# Patient Record
Sex: Male | Born: 1965 | Race: White | Hispanic: No | Smoking: Never smoker
Health system: Southern US, Community
[De-identification: ages and names within clinical notes are randomized; demographics above are authoritative.]

## PROBLEM LIST (undated history)

## (undated) DIAGNOSIS — K649 Unspecified hemorrhoids: Secondary | ICD-10-CM

## (undated) DIAGNOSIS — F329 Major depressive disorder, single episode, unspecified: Secondary | ICD-10-CM

## (undated) DIAGNOSIS — Z87442 Personal history of urinary calculi: Secondary | ICD-10-CM

## (undated) DIAGNOSIS — K219 Gastro-esophageal reflux disease without esophagitis: Secondary | ICD-10-CM

## (undated) DIAGNOSIS — IMO0002 Reserved for concepts with insufficient information to code with codable children: Secondary | ICD-10-CM

## (undated) DIAGNOSIS — G4730 Sleep apnea, unspecified: Secondary | ICD-10-CM

## (undated) DIAGNOSIS — F419 Anxiety disorder, unspecified: Secondary | ICD-10-CM

## (undated) DIAGNOSIS — E7800 Pure hypercholesterolemia, unspecified: Secondary | ICD-10-CM

## (undated) DIAGNOSIS — M109 Gout, unspecified: Secondary | ICD-10-CM

## (undated) DIAGNOSIS — M549 Dorsalgia, unspecified: Secondary | ICD-10-CM

## (undated) DIAGNOSIS — F32A Depression, unspecified: Secondary | ICD-10-CM

## (undated) DIAGNOSIS — R0989 Other specified symptoms and signs involving the circulatory and respiratory systems: Secondary | ICD-10-CM

## (undated) DIAGNOSIS — E039 Hypothyroidism, unspecified: Secondary | ICD-10-CM

## (undated) DIAGNOSIS — M539 Dorsopathy, unspecified: Secondary | ICD-10-CM

## (undated) DIAGNOSIS — G8929 Other chronic pain: Secondary | ICD-10-CM

## (undated) HISTORY — DX: Other specified symptoms and signs involving the circulatory and respiratory systems: R09.89

## (undated) HISTORY — DX: Unspecified hemorrhoids: K64.9

## (undated) HISTORY — DX: Sleep apnea, unspecified: G47.30

## (undated) HISTORY — DX: Dorsalgia, unspecified: M54.9

## (undated) HISTORY — PX: MANDIBLE FRACTURE SURGERY: SHX706

## (undated) HISTORY — DX: Depression, unspecified: F32.A

## (undated) HISTORY — PX: KNEE ARTHROSCOPY: SUR90

## (undated) HISTORY — DX: Major depressive disorder, single episode, unspecified: F32.9

## (undated) HISTORY — PX: OTHER SURGICAL HISTORY: SHX169

## (undated) HISTORY — DX: Other chronic pain: G89.29

## (undated) HISTORY — DX: Gastro-esophageal reflux disease without esophagitis: K21.9

## (undated) HISTORY — DX: Reserved for concepts with insufficient information to code with codable children: IMO0002

## (undated) HISTORY — DX: Anxiety disorder, unspecified: F41.9

---

## 1999-01-01 ENCOUNTER — Encounter: Admission: RE | Admit: 1999-01-01 | Discharge: 1999-04-01 | Payer: Self-pay | Admitting: Anesthesiology

## 1999-03-13 ENCOUNTER — Inpatient Hospital Stay (HOSPITAL_COMMUNITY): Admission: RE | Admit: 1999-03-13 | Discharge: 1999-03-14 | Payer: Self-pay | Admitting: Neurosurgery

## 1999-03-13 ENCOUNTER — Encounter: Payer: Self-pay | Admitting: Neurosurgery

## 1999-04-28 ENCOUNTER — Encounter: Payer: Self-pay | Admitting: Neurosurgery

## 1999-04-28 ENCOUNTER — Encounter: Admission: RE | Admit: 1999-04-28 | Discharge: 1999-04-28 | Payer: Self-pay | Admitting: Neurosurgery

## 1999-09-15 ENCOUNTER — Encounter: Admission: RE | Admit: 1999-09-15 | Discharge: 1999-12-14 | Payer: Self-pay | Admitting: Anesthesiology

## 2004-07-29 ENCOUNTER — Ambulatory Visit: Payer: Self-pay | Admitting: Cardiology

## 2004-08-13 ENCOUNTER — Ambulatory Visit: Payer: Self-pay | Admitting: Cardiology

## 2006-03-12 ENCOUNTER — Ambulatory Visit: Payer: Self-pay | Admitting: Gastroenterology

## 2006-03-23 ENCOUNTER — Ambulatory Visit (HOSPITAL_COMMUNITY): Admission: RE | Admit: 2006-03-23 | Discharge: 2006-03-23 | Payer: Self-pay | Admitting: Gastroenterology

## 2006-03-23 ENCOUNTER — Ambulatory Visit: Payer: Self-pay | Admitting: Gastroenterology

## 2006-03-23 ENCOUNTER — Encounter (INDEPENDENT_AMBULATORY_CARE_PROVIDER_SITE_OTHER): Payer: Self-pay | Admitting: Specialist

## 2006-05-06 ENCOUNTER — Ambulatory Visit: Payer: Self-pay | Admitting: Gastroenterology

## 2006-07-16 ENCOUNTER — Ambulatory Visit (HOSPITAL_COMMUNITY): Admission: RE | Admit: 2006-07-16 | Discharge: 2006-07-16 | Payer: Self-pay | Admitting: Neurology

## 2006-09-09 ENCOUNTER — Ambulatory Visit: Payer: Self-pay | Admitting: Cardiology

## 2007-10-31 ENCOUNTER — Ambulatory Visit: Payer: Self-pay | Admitting: Cardiology

## 2007-11-29 ENCOUNTER — Encounter: Payer: Self-pay | Admitting: Cardiology

## 2009-11-05 ENCOUNTER — Telehealth (INDEPENDENT_AMBULATORY_CARE_PROVIDER_SITE_OTHER): Payer: Self-pay | Admitting: *Deleted

## 2009-11-10 ENCOUNTER — Emergency Department (HOSPITAL_COMMUNITY)
Admission: EM | Admit: 2009-11-10 | Discharge: 2009-11-10 | Payer: Self-pay | Source: Home / Self Care | Admitting: Emergency Medicine

## 2009-11-25 ENCOUNTER — Encounter: Payer: Self-pay | Admitting: Cardiology

## 2009-12-17 ENCOUNTER — Encounter (INDEPENDENT_AMBULATORY_CARE_PROVIDER_SITE_OTHER): Payer: Self-pay | Admitting: *Deleted

## 2009-12-17 ENCOUNTER — Ambulatory Visit: Payer: Self-pay | Admitting: Cardiology

## 2009-12-17 DIAGNOSIS — G894 Chronic pain syndrome: Secondary | ICD-10-CM | POA: Insufficient documentation

## 2009-12-17 DIAGNOSIS — I10 Essential (primary) hypertension: Secondary | ICD-10-CM | POA: Insufficient documentation

## 2009-12-17 DIAGNOSIS — R079 Chest pain, unspecified: Secondary | ICD-10-CM | POA: Insufficient documentation

## 2010-01-03 ENCOUNTER — Ambulatory Visit: Payer: Self-pay | Admitting: Cardiology

## 2010-01-09 ENCOUNTER — Encounter (INDEPENDENT_AMBULATORY_CARE_PROVIDER_SITE_OTHER): Payer: Self-pay | Admitting: *Deleted

## 2010-01-15 ENCOUNTER — Telehealth (INDEPENDENT_AMBULATORY_CARE_PROVIDER_SITE_OTHER): Payer: Self-pay | Admitting: *Deleted

## 2010-01-20 ENCOUNTER — Telehealth (INDEPENDENT_AMBULATORY_CARE_PROVIDER_SITE_OTHER): Payer: Self-pay | Admitting: *Deleted

## 2010-01-30 ENCOUNTER — Telehealth (INDEPENDENT_AMBULATORY_CARE_PROVIDER_SITE_OTHER): Payer: Self-pay | Admitting: *Deleted

## 2010-02-03 ENCOUNTER — Telehealth (INDEPENDENT_AMBULATORY_CARE_PROVIDER_SITE_OTHER): Payer: Self-pay | Admitting: *Deleted

## 2010-03-18 ENCOUNTER — Encounter: Payer: Self-pay | Admitting: Cardiology

## 2010-07-03 ENCOUNTER — Encounter: Payer: Self-pay | Admitting: Internal Medicine

## 2010-07-03 ENCOUNTER — Ambulatory Visit
Admission: RE | Admit: 2010-07-03 | Discharge: 2010-07-03 | Payer: Self-pay | Source: Home / Self Care | Attending: Gastroenterology | Admitting: Gastroenterology

## 2010-07-03 DIAGNOSIS — K625 Hemorrhage of anus and rectum: Secondary | ICD-10-CM | POA: Insufficient documentation

## 2010-07-03 DIAGNOSIS — R1013 Epigastric pain: Secondary | ICD-10-CM | POA: Insufficient documentation

## 2010-07-16 LAB — BASIC METABOLIC PANEL
BUN: 12 mg/dL (ref 6–23)
CO2: 27 mEq/L (ref 19–32)
Calcium: 9.6 mg/dL (ref 8.4–10.5)
Chloride: 97 mEq/L (ref 96–112)
Creatinine, Ser: 0.96 mg/dL (ref 0.4–1.5)
GFR calc Af Amer: >60 mL/min (ref >60)
GFR calc non Af Amer: >60 mL/min (ref >60)
Glucose, Bld: 97 mg/dL (ref 70–99)
Potassium: 4.6 mEq/L (ref 3.5–5.1)
Sodium: 135 mEq/L (ref 135–145)

## 2010-07-16 LAB — HEMOGLOBIN AND HEMATOCRIT, BLOOD
HCT: 45.4 % (ref 39.0–52.0)
Hemoglobin: 16.1 g/dL (ref 13.0–17.0)

## 2010-07-17 ENCOUNTER — Ambulatory Visit (HOSPITAL_COMMUNITY)
Admission: RE | Admit: 2010-07-17 | Discharge: 2010-07-17 | Payer: Self-pay | Source: Home / Self Care | Attending: Internal Medicine | Admitting: Internal Medicine

## 2010-07-22 NOTE — Op Note (Signed)
NAME:  Craig Drake, Craig Drake               ACCOUNT NO.:  [id number]  MEDICAL RECORD NO.:  [id number]          PATIENT TYPE:  AMB  LOCATION:  DAY                           FACILITY:  [facility]  PHYSICIAN:  R. Roetta Sessions, M.D. DATE OF BIRTH:  1965-12-22  DATE OF PROCEDURE:  07/17/2010 DATE OF DISCHARGE:                              OPERATIVE REPORT   PROCEDURE:  EGD with esophageal dilation followed by gastric esophageal biopsy followed by diagnostic colonoscopy.  INDICATIONS FOR PROCEDURE:  A 45 year old gentleman wound with intermittent epigastric pain, intermittent reflux symptoms, nausea, some dysphagia to solids, and history of a known Schatzki's ring, also intermittent blood per rectum.  EGD and colonoscopy now being done. Risks, benefits, limitations, alternatives, imponderables have been discussed, questions answered.  Please see the documentation in medical record.  PROCEDURE NOTE:  O2 saturation, blood pressure, pulse, respirations were monitored throughout the entire procedure.  CONSCIOUS SEDATION:  Propofol per Dr. [facility]. Cetacaine spray for topical pharyngeal anesthesia.  INSTRUMENT:  Pentax video chip system.  FINDINGS:  EGD.  Examination of the tubular esophagus revealed circumferential distal esophageal erosions within 2 cm of the GE junction.  There was also appeared to be a soft short noncritical appearing stricture at this level.  EG junction was easily traversed with the scope.  Stomach:  Gastric cavity was emptied and insufflated well with air. Thorough examination of gastric mucosa including retroflexion of proximal stomach, esophagogastric junction demonstrated small hiatal hernia and multiple areas of nodularity with overlying erosions in the antrum.  There is no ulcer infiltrating process.  Pylorus was patent, easily traversed.  Examination of the bulb and second portion reveals a tiny bulbar erosions only.  THERAPEUTIC/DIAGNOSTIC MANEUVERS  PERFORMED:  Scope was withdrawn.  A 56- French Maloney dilator was passed to full insertion.  A look back revealed two superficial tears in proximal esophagus and mid esophagus. The stricture had been dilated as well with complication and with minimal bleeding.  Looking further for at the esophagus, there appeared to be some longitudinal furring of the esophagus.  There was no ringed appearance, however, there was a little area of nodularity with central depression in the proximal esophagus.  Please see photos.  I elected to go ahead and biopsy the nodule proximally separately in the mid distal esophagus just to make sure we are not doing with the element of his eosinophilic esophagitis as well.  Subsequent biopsies of the antral erosions were taken.  The patient tolerated the procedure well and was prepared for colonoscopy.  Digital rectal exam revealed no abnormalities.  Endoscopic findings:  Prep was adequate.  Colon: Colonic mucosa was surveyed from the rectosigmoid junction through the left transverse right colon to the appendiceal orifice, ileocecal valve/cecum.  These structures were well seen and photographed for the record.  From this level, scope was slowly and cautiously withdrawn. All previously mentioned mucosal surfaces were again seen.  The patient was noted to have scattered pancolonic diverticula.  However, the remainder of colonic mucosa appeared normal.  Scope was pulled down in to the rectum where a through examination of rectal mucosa including retroflexed view  of the anal verge demonstrated only some internal hemorrhoids.  The patient tolerated this procedure well.  Cecal withdrawal time 10 minutes.  IMPRESSION: 1. EGD.  Circumferential distal esophageal erosions superimposed on a     soft peptic stricture area of nodularity with central pressure,     proximal esophagus of uncertain significance status post biopsy,     status post esophageal body biopsies performed  after a 56-French     Maloney dilator was passed. 2. Small hiatal hernia, antral erosions status post biopsy bulbar     erosions otherwise D1 and D2 appeared normal.  COLONOSCOPY FINDINGS:  Internal hemorrhoids, otherwise normal rectum, pancolonic diverticula and colonic mucosa appeared normal.  RECOMMENDATIONS: 1. Clear liquid diet.  Remainder of the diet advanced slowly to a     regular diet tomorrow. 2. Stop omeprazole, begin Dexilant 60 mg orally daily.  Prescription     provided literature on hemorrhoids and diverticulosis and reflux     provided to Mr. Benassi. 3. Carafate suspension 1 g q.i.d. x5 days. 4. Benefiber, fiber supplement 1 tablespoon daily. 5. Further recommendations to follow pending review of path.     Jonathon Bellows, M.D.     RMR/MEDQ  D:  07/17/2010  T:  07/17/2010  Job:  [id number]  Electronically Signed by Lorrin Goodell M.D. on 07/22/2010 01:41:02 PM

## 2010-07-24 NOTE — Letter (Signed)
Summary: External Correspondence/ OFFICE VISIT [facility]  External Correspondence/ OFFICE VISIT [facility]   Imported By: Dorise Hiss 12/24/2009 14:07:59  _____________________________________________________________________  External Attachment:    Type:   Image     Comment:   External Document

## 2010-07-24 NOTE — Progress Notes (Signed)
Summary: PLEASE CALL TO TOUCH BASE ON SPELL IN SHOWER  Phone Note Call from Patient Call back at Home Phone [phone]   Caller: Patient Call For: NURSE Summary of Call: wants to touch base with nurse r/e spell he had in shower last week and now c/o pain in feet that he thinks came from all the hussle and bustle of getting children prepared for school this week. Nurse returned call and left message on machine since patient didn't pick up phone. Initial call taken by: Carlye Grippe,  February 03, 2010 10:41 AM  Follow-up for Phone Call        spoke with patient and he was informing us that he has informed his neurologist and received an earlier appointment with neurologist to evaluate spell in shower last week. Once again nurse informed him that MD here said that his symptoms needed to be evaluated by his PCP also and with neurologist since these c/o were no acute cardiac issues. Patient verbalized understanding of plan. Follow-up by: Carlye Grippe,  February 03, 2010 11:56 AM

## 2010-07-24 NOTE — Progress Notes (Signed)
Summary: elevated bp  Phone Note Call from Patient   Summary of Call: c/o having issues with elevated blood pressure.  Currently taking 2 bp meds.  States he has just moved back to [place] from [place].  Also, states that he really doesn't have PMD and Dr. Gerilyn Pilgrim has been treating him for everything.  BP is continually staying elevated as high as 115 on the bottom.  Advised him to establish with PMD and f/u with [place] until we see him in June.  Will mail list of PMD to pt.  Patient verbalized understanding.  Initial call taken by: Hoover Brunette, LPN,  Nov 05, 2009 9:53 AM

## 2010-07-24 NOTE — Progress Notes (Signed)
Summary: CONCERNS FOR HAVING A STROKE   Phone Note Call from Patient   Caller: Patient Call For: nurse Summary of Call: patient  called asking what are the chances of him having a stroke and patient's  phone went out. Called patient back and he request to have full set of labs and the chances of him having a stroke. Nurse informed him per MD that he didn't have any acute cardiac issues and to discuss need for labs with new PCP he will be choosing. Once again,patients phone cut off from low battery.  Initial call taken by: Carlye Grippe,  January 20, 2010 4:41 PM  Follow-up for Phone Call        definitely needs to speak with LMD.  Follow-up by: Lewayne Bunting, MD, [facility],  January 20, 2010 6:35 PM  Additional Follow-up for Phone Call Additional follow up Details #1::        Patient informed of the above via machine.  Additional Follow-up by: Carlye Grippe,  January 21, 2010 12:47 PM

## 2010-07-24 NOTE — Letter (Signed)
Summary: Engineer, materials at [facility]  [address]   [place], [place] [address]   Phone: [phone]  Fax: [phone]        January 09, 2010 MRN: [id number]   PASCUAL MANTEL [address] [place], [place]  [address]   Dear Mr. MEUTH,  Your test ordered by Selena Batten has been reviewed by your physician (or physician assistant) and was found to be normal or stable. Your physician (or physician assistant) felt no changes were needed at this time.  __X_ Echocardiogram  ____ Cardiac Stress Test  ____ Lab Work  ____ Peripheral vascular study of arms, legs or neck  ____ CT scan or X-ray  ____ Lung or Breathing test  ____ Other:   Thank you.   Hoover Brunette, LPN    Duane Boston, M.D., F.A.C.C. Thressa Sheller, M.D., F.A.C.C. Oneal Grout, M.D., F.A.C.C. Cheree Ditto, M.D., F.A.C.C. Daiva Nakayama, M.D., F.A.C.C. Kenney Houseman, M.D., F.A.C.C. Jeanne Ivan, PA-C

## 2010-07-24 NOTE — Assessment & Plan Note (Signed)
Summary: elevated bp  --agh   Visit Type:  elevated BP,chest pain Primary Provider:  none   History of Present Illness: the patient is a 45 year old male with a history of chronic pain syndrome followed by the pain clinic. The patient has been evaluated in 2009 for atypical chest pain as well as anxiety. He had a negative exercise treadmill test in February of 2006. He has labile hypertension, esophageal reflux disease and sleep apnea.  The patient reports substernal chest pain at the nature with radiation to the neck and both shoulders. He stayed minimal exertion provokes chest pain and associated shortness of breath. There is also diaphoresis. He recalls at least one occasion when this has been associated with nausea and vomiting. The patient stated that minimal exertion makes him "soaking wet". The patient is under significant amount of stress he is a single father taking care of 2 daughters. He used to work [employer], but is now disabled due to prior neck and back surgeries.  The patient has multiple cardiac risk factors. He denies any palpitations presyncope or syncope.  Current Medications (verified): 1)  Robaxin 500 Mg Tabs (Methocarbamol) .... Take 1 Tablet By Mouth Four Times A Day As Needed 2)  Imipramine Pamoate 100 Mg Caps (Imipramine Pamoate) .... Take 1 Tablet By Mouth Once A Day 3)  Avapro 300 Mg Tabs (Irbesartan) .... Take 1 Tablet By Mouth Once A Day 4)  Atenolol 50 Mg Tabs (Atenolol) .... Take 1 Tab By Mouth At Bedtime 5)  Lisinopril 20 Mg Tabs (Lisinopril) .... Take 1 Tablet By Mouth Once A Day 6)  Cymbalta 60 Mg Cpep (Duloxetine Hcl) .... Take 1 Tablet By Mouth Once A Day  Allergies (verified): No Known Drug Allergies  Comments:  Nurse/Medical Assistant: The patient's medication bottles and allergies were reviewed with the patient and were updated in the Medication and Allergy Lists.  Past History:  Past Medical History: anxiety and atypical chest pain Labile  hypertension GERD Sleep apnea  Past Surgical History: back and neck surgery  Family History: father died of stroke and had a pacemaker. Grandmother had a pacemaker. He had one brother died of an MI but he is a smoker.  Social History: the patient denies any tobacco use. He is disabled.  Review of Systems       The patient complains of fatigue, chest pain, shortness of breath, and anxiety.  The patient denies malaise, fever, weight gain/loss, vision loss, decreased hearing, hoarseness, palpitations, prolonged cough, wheezing, sleep apnea, coughing up blood, abdominal pain, blood in stool, nausea, vomiting, diarrhea, heartburn, incontinence, blood in urine, muscle weakness, joint pain, leg swelling, rash, skin lesions, headache, fainting, dizziness, depression, enlarged lymph nodes, easy bruising or bleeding, and environmental allergies.    Vital Signs:  Patient profile:   45 year old male Height:      72 inches Weight:      241 pounds BMI:     32.80 O2 Sat:      96 % Pulse rate:   113 / minute BP sitting:   133 / 96  (left arm) Cuff size:   large  Vitals Entered By: Carlye Grippe (December 17, 2009 10:24 AM)  Nutrition Counseling: Patient's BMI is greater than 25 and therefore counseled on weight management options.  Physical Exam  Additional Exam:  General: Well-developed, well-nourished in no distress head: Normocephalic and atraumatic eyes PERRLA/EOMI intact, conjunctiva and lids normal nose: No deformity or lesions mouth normal dentition, normal posterior pharynx neck: Supple, no  JVD.  No masses, thyromegaly or abnormal cervical nodes lungs: Normal breath sounds bilaterally without wheezing.  Normal percussion heart: regular rate and rhythm with normal S1 and S2, no S3 or S4.  PMI is normal.  No pathological murmurs abdomen: Normal bowel sounds, abdomen is soft and nontender without masses, organomegaly or hernias noted.  No hepatosplenomegaly musculoskeletal: Back normal,  normal gait muscle strength and tone normal pulsus: Pulse is normal in all 4 extremities Extremities: No peripheral pitting edema neurologic: Alert and oriented x 3 skin: Intact without lesions or rashes cervical nodes: No significant adenopathy psychologic: Normal affect    EKG  Procedure date:  12/17/2009  Findings:      sinus tachycardia. Otherwise normal tracing. Heart rate 111beats per minute.  Impression & Recommendations:  Problem # 1:  CHEST PAIN (ICD-786.50) the patient's chest pain has both typical and atypical features. His EKG in the office essentially within normal limits. He does have a sinus tachycardia. We will proceed with a dobutamine stress echocardiographic study I also asked the patient to increase his atenolol to 50 mg p.o. q. daily His updated medication list for this problem includes:    Atenolol 50 Mg Tabs (Atenolol) .Marland Kitchen... Take 1 tab by mouth at bedtime    Lisinopril 20 Mg Tabs (Lisinopril) .Marland Kitchen... Take 1 tablet by mouth once a day  Orders: EKG w/ Interpretation (93[address]) Echo- Dobutamine (Dobutamine Echo)  Problem # 2:  ESSENTIAL HYPERTENSION, BENIGN (ICD-401.1) blood pressure is well controlled. I made no changes in his medical regimen. His updated medication list for this problem includes:    Avapro 300 Mg Tabs (Irbesartan) .Marland Kitchen... Take 1 tablet by mouth once a day    Atenolol 50 Mg Tabs (Atenolol) .Marland Kitchen... Take 1 tab by mouth at bedtime    Lisinopril 20 Mg Tabs (Lisinopril) .Marland Kitchen... Take 1 tablet by mouth once a day  Orders: EKG w/ Interpretation (93[address])  Problem # 3:  CHRONIC PAIN SYNDROME (ICD-338.4) followup in the pain clinic.  Patient Instructions: 1)  Atenolol 50mg  at bedtime 2)  Dobutamine Stress Echo  3)  Follow up in  3 months  Prescriptions: ATENOLOL 50 MG TABS (ATENOLOL) Take 1 tab by mouth at bedtime  #30 x 6   Entered by:   Hoover Brunette, LPN   Authorized by:   Lewayne Bunting, MD, [facility]   Signed by:   Hoover Brunette, LPN on 04/54/0981   Method  used:   Electronically to        [facility]* (retail)       [address]       [place], [place]  [address]       Ph: [phone]       Fax: [phone]   RxID:   [id number]

## 2010-07-24 NOTE — Letter (Signed)
Summary: Dobutamine Echocardiogram  [facility] at [facility] [address]   [place], [place] [address]   Phone: [phone]  Fax: [phone]      [facility]  Dobutamine Echocardiogram    Neely Clutter  Appointment Date:_  Appointment Time:_   Your doctor has ordered a Dobutamine echo to help determine the condition of our heart. If you take blood pressure medication, ask your doctor if you should take your medications the day of the procedure. Do not eat and drink 4 hours before the test is scheduled.  You will be asked to undress from the waist up and given a hospital gown to wear, so dress comfortaly from the waist down.  You will need to register at the Outpatient Department at [facility], located at the front enttrance of the hospital. Make sure to bring your insurance information and a form of ID.  Your appointment will last approximately one and one-half hours

## 2010-07-24 NOTE — Letter (Signed)
Summary: TCS/EGD/ED ORDER  [facility]CS/EGD/ED ORDER   Imported By: Rexene Alberts 07/03/2010 12:04:13  _____________________________________________________________________  External Attachment:    Type:   Image     Comment:   External Document

## 2010-07-24 NOTE — Progress Notes (Signed)
Summary: weakness,fatique      Phone Note Other Incoming   Summary of Call: Pt. notified of normal echo.  States he continues to have profuse sweating and weakness, fatique,leg pain.  Not sleeping good due to sleep apnea.  Blood pressure today was 150/76.  Any other suggestions for him? Patient also requesting whether or not he need labwork since he hasnt had any done in a very long time. York Pellant, LPN  January 15, 2010 4:32 PM      Follow-up for Phone Call        Would discuss with LMD patient has chronic pain syndrome and significant anxiety. No acute cardiac sissues Follow-up by: Lewayne Bunting, MD, [facility],  January 16, 2010 5:36 PM  Additional Follow-up for Phone Call Additional follow up Details #1::        No answer.  Hoover Brunette, LPN  January 17, 2010 9:40 AM   Patient notified.   Stated he did not have PMD.  Advised him to contact his pain mgt MD or to try the health dept.  He may also try calling local PMD's to see if taking new pt's.  Scheduled 3 month f/u for September.   Additional Follow-up by: Hoover Brunette, LPN,  January 20, 2010 4:07 PM

## 2010-07-24 NOTE — Letter (Signed)
Summary: Appointment -missed  [facility] at [facility] [address]   [place], [place] [address]   Phone: [phone]  Fax: [phone]     March 18, 2010 MRN: [id number]     Craig Drake [address] [place], [place]  [address]     Dear [name],  Our records indicate you missed your appointment on March 18, 2010                        with Dr.  Andee Lineman.   It is very important that we reach you to reschedule this appointment. We look forward to participating in your health care needs.   Please contact us at the number listed above at your earliest convenience to reschedule this appointment.   Sincerely,    Glass blower/designer
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
## 2010-07-24 NOTE — Progress Notes (Signed)
Summary: PLEASE CALL  Phone Note Call from Patient Call back at Home Phone [phone]   Caller: Patient Call For: nurse Summary of Call: patient left message on nurse voicemail that his bp was 122/86 HR-121 and he had brief episode where he blacked out in shower. Patient stated that he seemed to be okay and he would let neurologist know. Nurse called patient back and left message on machine that call was returned. Initial call taken by: Carlye Grippe,  January 30, 2010 3:17 PM

## 2010-08-04 ENCOUNTER — Telehealth (INDEPENDENT_AMBULATORY_CARE_PROVIDER_SITE_OTHER): Payer: Self-pay | Admitting: *Deleted

## 2010-08-13 NOTE — Progress Notes (Signed)
  Phone Note Call from Patient   Reason for Call: Talk to Nurse Summary of Call: Pt called stating he has noticed a small amount of blood after bowel movements the past two days. He did have a TCS on 07/17/10 and wants to know if this is normal or should he come in for an exam. He deneis any pain. Pt can be reached at [phone] or [phone], Initial call taken by: Peggyann Shoals,  August 04, 2010 10:57 AM     Appended Document:  NOT normal but does occur occasionally after  a tcs; this should go away soon; if it doesn't , let us know  Appended Document:  LM with mother for pt to call.   Appended Document:  PT IS AWARE

## 2010-08-14 ENCOUNTER — Encounter: Payer: Self-pay | Admitting: Gastroenterology

## 2010-08-19 NOTE — Medication Information (Signed)
Summary: CARAFATE 1GM/10ML  CARAFATE 1GM/10ML   Imported By: Rexene Alberts 08/14/2010 12:04:06  _____________________________________________________________________  External Attachment:    Type:   Image     Comment:   External Document  Appended Document: CARAFATE 1GM/10ML only X 5 days per op note. does not need refill.   Appended Document: CARAFATE 1GM/10ML pharmacy informed

## 2010-09-29 ENCOUNTER — Ambulatory Visit: Payer: Self-pay | Admitting: Gastroenterology
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
## 2010-11-07 NOTE — Op Note (Signed)
NAME:  Craig Drake, Craig Drake               ACCOUNT NO.:  [id number]   MEDICAL RECORD NO.:  [id number]          PATIENT TYPE:  AMB   LOCATION:  DAY                           FACILITY:  [facility]   PHYSICIAN:  Kassie Mends, M.D.      DATE OF BIRTH:  1966/04/24   DATE OF PROCEDURE:  03/23/2006  DATE OF DISCHARGE:  03/23/2006                                 OPERATIVE REPORT   PROCEDURE:  Esophagogastroduodenoscopy with cold forceps biopsy and Savary  dilation.   INDICATION FOR EXAM:  Craig Drake is a 45 year old male who presented with  solid food dysphagia and uncontrolled reflux.  Since his visit in  September2007 his reflux has improved.   FINDINGS:  1. Patent Schatzki's ring in the distal esophagus.  An 18-mm Savary      dilator was passed with moderate resistance.  His distal esophagus had      no evidence of Barrett's.  2. Nodular gastritis.  Biopsies obtained for H-pylori gastritis.  3. Normal duodenal bulb and second portion of the duodenum.   RECOMMENDATIONS:  1. No aspirin or NSAID's for 7 days.  2. Clear liquid diet for 24 hours and then may resume previous diet.  3. Follow up in December 2007.  4. Continue Nexium.  5. Will call patient with biopsy report.   MEDICATIONS:  1. Demerol 100 mg IV.  2. Versed 7 mg IV.  3. Phenergan 25 mg IV.   PROCEDURE TECHNIQUE:  Physical exam was performed and informed consent was  obtained from the patient after explaining the benefits, risks and  alternatives to the procedure.  The patient was connected to monitor and  placed in the left lateral position.  Continuous suction was provided by  nasal cannula and IV medicine administered through an indwelling cannula.  After administration of sedation, the patient's esophagus was intubated and  the scope was passed under direct visualization to the second portion of the  duodenum.  The scope was withdrawn and cold forceps biopsies were taken in  the antrum.  The scope was subsequently removed slowly  by carefully  examining the color, texture, anatomy and integrity of the mucosa on the way  out.  The scope was then advanced into the antrum and the Savary guide wire  was advanced into the antrum while the scope was withdrawn.  The  Savary dilator was advanced over the wire, with moderate resistance.  The  Savary dilator and wire were withdrawn.  The scope was advanced and a small  rent was seen in the distal esophagus.  The patient was recovered in the  endoscopy suite and discharged to home in satisfactory condition.      Kassie Mends, M.D.  Electronically Signed     SM/MEDQ  D:  03/25/2006  T:  03/26/2006  Job:  [id number]   cc:   Darleen Crocker A. Gerilyn Pilgrim, M.D.  Fax: [phone]
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
## 2011-12-21 ENCOUNTER — Other Ambulatory Visit: Payer: Self-pay | Admitting: Family Medicine

## 2012-01-11 ENCOUNTER — Encounter: Payer: Self-pay | Admitting: *Deleted

## 2012-01-11 ENCOUNTER — Encounter: Payer: Self-pay | Admitting: Cardiology

## 2012-01-11 ENCOUNTER — Ambulatory Visit (INDEPENDENT_AMBULATORY_CARE_PROVIDER_SITE_OTHER): Payer: Medicaid Other | Admitting: Cardiology

## 2012-01-11 ENCOUNTER — Other Ambulatory Visit: Payer: Self-pay | Admitting: Cardiology

## 2012-01-11 ENCOUNTER — Telehealth: Payer: Self-pay

## 2012-01-11 VITALS — BP 115/82 | HR 90 | Ht 74.0 in | Wt 252.4 lb

## 2012-01-11 DIAGNOSIS — R079 Chest pain, unspecified: Secondary | ICD-10-CM

## 2012-01-11 DIAGNOSIS — I10 Essential (primary) hypertension: Secondary | ICD-10-CM

## 2012-01-11 DIAGNOSIS — E785 Hyperlipidemia, unspecified: Secondary | ICD-10-CM

## 2012-01-11 DIAGNOSIS — Z79899 Other long term (current) drug therapy: Secondary | ICD-10-CM

## 2012-01-11 MED ORDER — ASPIRIN EC 81 MG PO TBEC: 81.0000 mg | DELAYED_RELEASE_TABLET | Freq: Every day | ORAL | Status: AC

## 2012-01-11 MED ORDER — OMEGA-3-ACID ETHYL ESTERS 1 G PO CAPS: 2.0000 g | ORAL_CAPSULE | Freq: Two times a day (BID) | ORAL | Status: DC

## 2012-01-11 NOTE — Assessment & Plan Note (Signed)
Diet is poor.  Triglycerides are very high, in range where pancreatitis is a risk.  I talked to him about dietary changes.  I would also like him to take Lovaza 2 g bid.  He will get lipids again in 2 months.  He has mild transaminitis.  He rarely drinks.  I suspect fatty liver (NASH).  He should get viral hepatitis workup through his PCP if it has not already been done .

## 2012-01-11 NOTE — Progress Notes (Signed)
Patient ID: Craig Drake, male   DOB: [birth year], 46 y.o.   MRN: [id number] PCP: Dr. Loney Hering  46 yo with history of HTN and GERD presents for cardiology evaluation of chest/neck pain.  Patient will periodically get a squeezing-type sensation in his neck associated with mild upper chest squeezing.  He notes this most often when he is "playing around" with his daughters and laughing.  He does not notice it when walking and doing housework.  It has occurred every month or so for a couple of years.  He is not very active because of his low back pain.  He is on disability.  He does not note exertional dyspnea but never walks long distances.  He does note note what he considers to be excessive diaphoresis with activity.   He says he had a stress test about 4 years ago that was normal.   ECG: NSR, normal  Labs (7/13): K 3.8, creatinine 0.91, ALT 49, AST 57, TGs 586, HDL 43  PMH: 1. GERD with hiatal hernia and history of esophageal stricture.  2. HTN 3. Low back pain, severe.  He is disabled.  4. Right breast fatty tumor. 5. Migraines  SH: Nonsmoker.  Divorced, raises his 2 daughters.  Lives in [place].  On disability for his back, used to work for [employer].   FH: Father with PCM in his 77s.  Brother with CAD and PCI in his early 76s.   ROS: All systems reviewed and negative except as per HPI.   Current Outpatient Prescriptions  Medication Sig Dispense Refill  . DULoxetine (CYMBALTA) 60 MG capsule Take 60 mg by mouth daily.        Marland Kitchen HYDROcodone-acetaminophen (VICODIN) 5-500 MG per tablet Take 1 tablet by mouth every 6 (six) hours as needed.      Marland Kitchen lisinopril (PRINIVIL,ZESTRIL) 10 MG tablet Take 10 mg by mouth daily.      . pantoprazole (PROTONIX) 40 MG tablet Take 40 mg by mouth daily.      . simvastatin (ZOCOR) 20 MG tablet Take 20 mg by mouth at bedtime.      Marland Kitchen aspirin EC 81 MG tablet Take 1 tablet (81 mg total) by mouth daily.      Marland Kitchen omega-3 acid ethyl esters (LOVAZA) 1 G capsule Take 2 capsules  (2 g total) by mouth 2 (two) times daily.  120 capsule  6    BP 115/82  Pulse 90  Ht 6\' 2"  (1.88 m)  Wt 252 lb 6.4 oz (114.488 kg)  BMI 32.41 kg/m2 General: NAD, obese Neck: No JVD, no thyromegaly or thyroid nodule.  Lungs: Clear to auscultation bilaterally with normal respiratory effort. CV: Nondisplaced PMI.  Heart regular S1/S2, no S3/S4, no murmur.  No peripheral edema.  No carotid bruit.  Normal pedal pulses.  Abdomen: Soft, nontender, no hepatosplenomegaly, no distention.  Skin: Intact without lesions or rashes.  Neurologic: Alert and oriented x 3.  Psych: Normal affect. Extremities: No clubbing or cyanosis.  HEENT: Normal.

## 2012-01-11 NOTE — Assessment & Plan Note (Signed)
Somewhat atypical.  Notes it when "playing around" with his daughters and laughing.  Will get upper chest and neck tightness and squeezing.  Does not notice with walking or housework but he is very inactive due to back pain.  He does report what he considers to be undue diaphoresis with exertion.  Cardiac risk factors include family history of premature CAD, hyperlipidemia, and HTN.  Normal ECG.  - Could not walk on treadmill.  I will get a Lexiscan myoview to risk stratify.  - ASA 81 mg daily for now.

## 2012-01-11 NOTE — Assessment & Plan Note (Signed)
BP controlled on current regimen.

## 2012-01-11 NOTE — Telephone Encounter (Signed)
No precert required.  Medicaid only 

## 2012-01-11 NOTE — Telephone Encounter (Signed)
Lexiscan myoview scheduled for 01-20-2012 [facility] Checking percert

## 2012-01-11 NOTE — Patient Instructions (Addendum)
   Lexiscan Myoview (stress test)  Aspirin 81mg  daily  Lovaza 2gm twice a day    Labs:  Due in 2 months - fasting lipid & liver panel.  Reminder:  Nothing to eat or drink after 12 midnight prior to labs.  Follow up in  2 weeks

## 2012-01-27 ENCOUNTER — Ambulatory Visit: Payer: Medicaid Other | Admitting: Physician Assistant

## 2012-01-27 DIAGNOSIS — R079 Chest pain, unspecified: Secondary | ICD-10-CM

## 2012-01-29 ENCOUNTER — Ambulatory Visit (INDEPENDENT_AMBULATORY_CARE_PROVIDER_SITE_OTHER): Payer: Medicaid Other | Admitting: Physician Assistant

## 2012-01-29 ENCOUNTER — Encounter: Payer: Self-pay | Admitting: Physician Assistant

## 2012-01-29 VITALS — BP 128/84 | HR 103 | Ht 74.0 in | Wt 254.4 lb

## 2012-01-29 DIAGNOSIS — G894 Chronic pain syndrome: Secondary | ICD-10-CM

## 2012-01-29 NOTE — Patient Instructions (Addendum)
Your physician recommends that you schedule a follow-up appointment in: as needed with Dr. Shirlee Latch. Your physician recommends that you continue on your current medications as directed. Please refer to the Current Medication list given to you today.

## 2012-01-29 NOTE — Assessment & Plan Note (Signed)
No further workup indicated. Recent Lexiscan Myoview was normal; EF 70%. I did, however, stress the importance of risk factor modification, with respect to adequate HTN and borderline DM control. I suggested he remain on low-dose ASA, for primary prevention. Regarding lipid management, he expressed concern over recent suggestion of possible increased risk of prostate CA, with omega-3 fatty acid supplements. We, therefore, agreed to try to manage this with aggressive dietary measures. However, I did advise him to followup with his primary M.D., to ensure that his levels are trending downward. If they remain persistently elevated, he may require referral to a lipid clinic for further recommendations. From a cardiac standpoint, patient is cleared to return to his primary M.D., and can return to Dr. Shirlee Latch on an as-needed basis.
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
## 2013-02-09 ENCOUNTER — Ambulatory Visit: Payer: Medicaid Other | Admitting: Physician Assistant

## 2015-04-08 ENCOUNTER — Ambulatory Visit: Payer: Self-pay | Admitting: "Endocrinology

## 2015-06-23 HISTORY — PX: HEMORRHOID BANDING: SHX5850

## 2015-08-16 ENCOUNTER — Other Ambulatory Visit: Payer: Self-pay | Admitting: "Endocrinology

## 2015-08-29 ENCOUNTER — Encounter: Payer: Self-pay | Admitting: Internal Medicine

## 2015-09-12 ENCOUNTER — Other Ambulatory Visit: Payer: Self-pay

## 2015-09-12 ENCOUNTER — Ambulatory Visit (INDEPENDENT_AMBULATORY_CARE_PROVIDER_SITE_OTHER): Payer: Medicaid Other | Admitting: Nurse Practitioner

## 2015-09-12 ENCOUNTER — Encounter: Payer: Self-pay | Admitting: Nurse Practitioner

## 2015-09-12 VITALS — BP 131/88 | HR 82 | Temp 97.4°F | Ht 74.0 in | Wt 266.4 lb

## 2015-09-12 DIAGNOSIS — K625 Hemorrhage of anus and rectum: Secondary | ICD-10-CM

## 2015-09-12 DIAGNOSIS — K219 Gastro-esophageal reflux disease without esophagitis: Secondary | ICD-10-CM | POA: Insufficient documentation

## 2015-09-12 DIAGNOSIS — R1310 Dysphagia, unspecified: Secondary | ICD-10-CM | POA: Insufficient documentation

## 2015-09-12 DIAGNOSIS — K2100 Gastro-esophageal reflux disease with esophagitis, without bleeding: Secondary | ICD-10-CM

## 2015-09-12 NOTE — Progress Notes (Signed)
Primary Care Physician:  Deloria Lair, MD Primary Gastroenterologist:  Dr. Gala Romney  Chief Complaint  Patient presents with  . Rectal Bleeding  . Dysphagia  . Gastroesophageal Reflux    HPI:   Craig Drake is a 50 y.o. male who presents on Referral from primary care for rectal bleeding, GERD, dysphagia. Last saw PCP on 08/21/2015. He has multiple medical issues including pituitary microadenoma, history of GERD, history of dysphagia status post dilation, back problems. Labs included with PCP note found normal hemoglobin and hematocrit, alkaline phosphatase mildly elevated at 99 otherwise normal liver function, normal kidney function.  He had an EGD and colonoscopy completed on 07/17/2010 also for dysphagia, nausea, reflux symptoms, history of known Schatzki's ring as well as rectal bleeding. EGD findings included a distal esophageal circumferential erosions within 2 cm the GE junction, noncritical stricture, status post Maloney dilation and biopsy, small hiatal hernia, antral erosions status post biopsy. Colonoscopy found internal hemorrhoids, otherwise normal rectum. Biopsies found reactive gastropathy without H. 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 Recommended stop omeprazole and begin Dexilant daily, Carafate 1 g 4 times a day for 5 days, fiber supplement.  Today he states he is having dysphagia symptoms again, has to consciously "relax his throat" for food to pass or he will have to regurgitate. Symptoms about once a week. Last dilation worked well for him symptomatically. No pill dysphagia. GERD still with breakthrough symptoms 1-2 times a month, aggravated by stress or worsening back trouble. Takes Prilosec daily as well as Protonix OTC as well. Had one episode of rectal bleeding states about a half a cup, felt weakness afterward. Had one episode and none since. Has a  history of internal hemorrhoids. No other hemorrhoid symptoms. Has LLQ abdominal pain, occasional nausea. Denies vomiting, unintentional weight loss, fever, chills. Does have night sweats "only in my head." Poor sleep, is on medication to help. Has rare chest pain and was seen by cardiology with stress test done and told pain is non-cardiac, likely anxiety. Denies dyspnea, dizziness, lightheadedness, syncope, near syncope. Denies any other upper or lower GI symptoms.  Past Medical History  Diagnosis Date  . Anxiety     and atypical chest pain  . Labile hypertension   . GERD (gastroesophageal reflux disease)   . Sleep apnea     does not wear CPAP  . Depression   . DDD (degenerative disc disease)   . Chronic back pain     Past Surgical History  Procedure Laterality Date  . Neck fusion    . Mandible fracture surgery      wired shut after a fall    Current Outpatient Prescriptions  Medication Sig Dispense Refill  . aspirin 81 MG tablet Take 81 mg by mouth daily.    . diazepam (VALIUM) 5 MG tablet Take 5 mg by mouth 2 (two) times daily.    Marland Kitchen HYDROcodone-acetaminophen (NORCO) 10-325 MG tablet Take 1 tablet by mouth every 6 (six) hours as needed.    . lidocaine (XYLOCAINE) 5 % ointment Apply 1 application topically 2 (two) times daily as needed.    Marland Kitchen lisinopril (PRINIVIL,ZESTRIL) 10 MG tablet Take 20 mg by mouth daily.     . mirtazapine (REMERON) 15 MG tablet Take 15 mg by mouth at bedtime.    . NON FORMULARY Vitamin D  50000 units  Once weekly    . pantoprazole (PROTONIX) 40 MG tablet  Take 40 mg by mouth daily.    . sertraline (ZOLOFT) 100 MG tablet Take 100 mg by mouth daily. Takes one and a half tablets daily    . simvastatin (ZOCOR) 20 MG tablet Take 20 mg by mouth at bedtime.     No current facility-administered medications for this visit.    Allergies as of 09/12/2015 - Review Complete 09/12/2015  Allergen Reaction Noted  . Ibuprofen  09/12/2015  . Lyrica [pregabalin]   01/11/2012    Family History  Problem Relation Age of Onset  . [name] Mother   . Hypertension Mother   . Lung cancer Father   . Colon cancer Mother 16    Passed away 2010-09-29 from stage IV CRC    Social History   Social History  . Marital Status: Divorced    Spouse Name: N/A  . Number of Children: N/A  . Years of Education: N/A   Occupational History  . Not on file.   Social History Main Topics  . Smoking status: Never Smoker   . Smokeless tobacco: Never Used     Comment: Never smoked  . Alcohol Use: No  . Drug Use: No  . Sexual Activity: Not on file   Other Topics Concern  . Not on file   Social History Narrative    Review of Systems: General: Negative for anorexia, weight loss, fever, chills, fatigue, weakness. Eyes: Negative for vision changes.  ENT: Negative for hoarseness. CV: Negative for chest pain, angina, palpitations, peripheral edema.  Respiratory: Negative for dyspnea at rest, cough, sputum, wheezing.  GI: See history of present illness. MS: Admits chronic low back pain.  Endo: Negative for unusual weight change.  Heme: Negative for bruising or bleeding. Allergy: Negative for rash or hives.    Physical Exam: BP 131/88 mmHg  Pulse 82  Temp(Src) 97.4 F (36.3 C) (Oral)  Ht 6\' 2"  (1.88 m)  Wt 266 lb 6.4 oz (120.838 kg)  BMI 34.19 kg/m2 General:   Alert and oriented. Pleasant and cooperative. Well-nourished and well-developed.  Head:  Normocephalic and atraumatic. Eyes:  Without icterus, sclera clear and conjunctiva pink.  Ears:  Normal auditory acuity. Cardiovascular:  S1, S2 present without murmurs appreciated. Extremities without clubbing or edema. Respiratory:  Clear to auscultation bilaterally. No wheezes, rales, or rhonchi. No distress.  Gastrointestinal:  +BS, rounded but soft, and non-distended. Mile LLQ TTP. No guarding or rebound.  Rectal:  Deferred  Musculoskalatal:  Ambulates with a cane. Skin:  Intact without significant lesions or  rashes. Neurologic:  Alert and oriented x4;  grossly normal neurologically. Psych:  Alert and cooperative. Normal mood and affect. Heme/Lymph/Immune: No excessive bruising noted.    09/13/2015 4:29 PM   Disclaimer: This note was dictated with voice recognition software. Similar sounding words can inadvertently be transcribed and may not be corrected upon review.

## 2015-09-12 NOTE — Patient Instructions (Signed)
1. We will schedule your procedures for you. 2. Return for follow-up in 2 months.

## 2015-09-13 NOTE — Assessment & Plan Note (Addendum)
Persistent GERD symptoms with breakthrough 1-2 times a month, currently on Prilosec and Protonix over-the-counter. Given this, his history of circumferential erosions near the GE junction, and his dysphagia symptoms as discussed below. We'll plan to add upper endoscopy with possible dilation to his needed colonoscopy. Return for follow-up in 2 months.

## 2015-09-13 NOTE — Assessment & Plan Note (Addendum)
Patient complaints of rectal bleeding in the setting of known hemorrhoids. Last colonoscopy was completed 07/17/2010 and found internal hemorrhoids otherwise normal. However, a few weeks after his colonoscopy his mother underwent colonoscopy and was diagnosed with colon cancer and passed away the same year at age 51. Given new information about first degree relative +CRC and recurrent rectal bleeding, will schedule him for a colonoscopy, now 5 years since last exam. Return for follow-up in 2 months.  Proceed with TCS in the OR on propofol/MAC with Dr. Gala Romney in near future: the risks, benefits, and alternatives have been discussed with the patient in detail. The patient states understanding and desires to proceed.  The patient is currently on Remeron, Norco, Valium. No anticoagulants, no other chronic pain medications or anxiolytics. No antidepressants. Denies alcohol and drug use. Due to polypharmacy we'll plan for the procedure and the OR on propofol/MAC to promote adequate sedation.

## 2015-09-13 NOTE — Assessment & Plan Note (Addendum)
Patient with recurrent solid food dysphagia symptoms, has previously had an endoscopy with esophageal dilation for Schatzki's ring; noted symptom improvement afterward. Given his recurrent dysphagia and persistent GERD on PPI as noted above, we will add endoscopy with possible dilation to his needed colonoscopy as noted above. Return for follow-up in 2 months.  Proceed with EGD+/- dilation in the OR on propofol/MAC with Dr. Gala Romney in near future: the risks, benefits, and alternatives have been discussed with the patient in detail. The patient states understanding and desires to proceed.  The patient is not on any anticoagulants. Is on Remeron, Norco, Valium. No other chronic pain medications, anxiolytics. No antidepressants. We will proceed with the procedure and the OR on propofol/MAC to promote adequate sedation.

## 2015-09-16 ENCOUNTER — Other Ambulatory Visit: Payer: Self-pay

## 2015-09-16 MED ORDER — PEG 3350-KCL-NA BICARB-NACL 420 G PO SOLR: 4000.0000 mL | Freq: Once | ORAL | Status: DC

## 2015-09-17 NOTE — Progress Notes (Signed)
cc'ed to pcp °

## 2015-09-19 ENCOUNTER — Other Ambulatory Visit: Payer: Self-pay | Admitting: "Endocrinology

## 2015-09-30 NOTE — Patient Instructions (Signed)
Craig LAFRENIER  09/30/2015     @PREFPERIOPPHARMACY @   Your procedure is scheduled on  10/07/2015   Report to [name] at  615  A.M.  Call this number if you have problems the morning of surgery:  [phone]   Remember:  Do not eat food or drink liquids after midnight.  Take these medicines the morning of surgery with A SIP OF WATER  Valium, hydrocodone, lisinopril, protonix, zoloft.   Do not wear jewelry, make-up or nail polish.  Do not wear lotions, powders, or perfumes.  You may wear deodorant.  Do not shave 48 hours prior to surgery.  Men may shave face and neck.  Do not bring valuables to the hospital.  [facility] is not responsible for any belongings or valuables.  Contacts, dentures or bridgework may not be worn into surgery.  Leave your suitcase in the car.  After surgery it may be brought to your room.  For patients admitted to the hospital, discharge time will be determined by your treatment team.  Patients discharged the day of surgery will not be allowed to drive home.   Name and phone number of your driver:   family Special instructions:  Follow the diet and prep instructions given to you by Dr [name] office.  Please read over the following fact sheets that you were given. Coughing and Deep Breathing, Surgical Site Infection Prevention, Anesthesia Post-op Instructions and Care and Recovery After Surgery      Esophagogastroduodenoscopy Esophagogastroduodenoscopy (EGD) is a procedure that is used to examine the lining of the esophagus, stomach, and first part of the small intestine (duodenum). A long, flexible, lighted tube with a camera attached (endoscope) is inserted down the throat to view these organs. This procedure is done to detect problems or abnormalities, such as inflammation, bleeding, ulcers, or growths, in order to treat them. The procedure lasts 5-20 minutes. It is usually an outpatient procedure, but it may need to be  performed in a hospital in emergency cases. LET [name] CARE PROVIDER KNOW ABOUT:  Any allergies you have.  All medicines you are taking, including vitamins, herbs, eye drops, creams, and over-the-counter medicines.  Previous problems you or members of your family have had with the use of anesthetics.  Any blood disorders you have.  Previous surgeries you have had.  Medical conditions you have. RISKS AND COMPLICATIONS Generally, this is a safe procedure. However, problems can occur and include:  Infection.  Bleeding.  Tearing (perforation) of the esophagus, stomach, or duodenum.  Difficulty breathing or not being able to breathe.  Excessive sweating.  Spasms of the larynx.  Slowed heartbeat.  Low blood pressure. BEFORE THE PROCEDURE  Do not eat or drink anything after midnight on the night before the procedure or as directed by your health care provider.  Do not take your regular medicines before the procedure if your health care provider asks you not to. Ask your health care provider about changing or stopping those medicines.  If you wear dentures, be prepared to remove them before the procedure.  Arrange for someone to drive you home after the procedure. PROCEDURE  A numbing medicine (local anesthetic) may be sprayed in your throat for comfort and to stop you from gagging or coughing.  You will have an IV tube inserted in a vein in your hand or arm. You will receive medicines and fluids through this tube.  You will be given a medicine to relax you (sedative).  A pain reliever will be given through the IV tube.  A mouth guard may be placed in your mouth to protect your teeth and to keep you from biting on the endoscope.  You will be asked to lie on your left side.  The endoscope will be inserted down your throat and into your esophagus, stomach, and duodenum.  Air will be put through the endoscope to allow your health care provider to clearly view the  lining of your esophagus.  The lining of your esophagus, stomach, and duodenum will be examined. During the exam, your health care provider may:  Remove tissue to be examined under a microscope (biopsy) for inflammation, infection, or other medical problems.  Remove growths.  Remove objects (foreign bodies) that are stuck.  Treat any bleeding with medicines or other devices that stop tissues from bleeding (hot cautery, clipping devices).  Widen (dilate) or stretch narrowed areas of your esophagus and stomach.  The endoscope will be withdrawn. AFTER THE PROCEDURE  You will be taken to a recovery area for observation. Your blood pressure, heart rate, breathing rate, and blood oxygen level will be monitored often until the medicines you were given have worn off.  Do not eat or drink anything until the numbing medicine has worn off and your gag reflex has returned. You may choke.  Your health care provider should be able to discuss his or her findings with you. It will take longer to discuss the test results if any biopsies were taken.   This information is not intended to replace advice given to you by your health care provider. Make sure you discuss any questions you have with your health care provider.   Document Released: 10/09/2004 Document Revised: 06/29/2014 Document Reviewed: 05/11/2012 Elsevier Interactive Patient Education 2016 [place]. Esophagogastroduodenoscopy, Care After Refer to this sheet in the next few weeks. These instructions provide you with information about caring for yourself after your procedure. Your health care provider may also give you more specific instructions. Your treatment has been planned according to current medical practices, but problems sometimes occur. Call your health care provider if you have any problems or questions after your procedure. WHAT TO EXPECT AFTER THE PROCEDURE After your procedure, it is typical to feel:  Soreness in your  throat.  Pain with swallowing.  Sick to your stomach (nauseous).  Bloated.  Dizzy.  Fatigued. HOME CARE INSTRUCTIONS  Do not eat or drink anything until the numbing medicine (local anesthetic) has worn off and your gag reflex has returned. You will know that the local anesthetic has worn off when you can swallow comfortably.  Do not drive or operate machinery until directed by your health care provider.  Take medicines only as directed by your health care provider. SEEK MEDICAL CARE IF:   You cannot stop coughing.  You are not urinating at all or less than usual. SEEK IMMEDIATE MEDICAL CARE IF:  You have difficulty swallowing.  You cannot eat or drink.  You have worsening throat or chest pain.  You have dizziness or lightheadedness or you faint.  You have nausea or vomiting.  You have chills.  You have a fever.  You have severe abdominal pain.  You have black, tarry, or bloody stools.   This information is not intended to replace advice given to you by your health care provider. Make sure you discuss any questions you have with your health care provider.  Document Released: 05/25/2012 Document Revised: 06/29/2014 Document Reviewed: 05/25/2012 Elsevier Interactive Patient Education 2016 [employer] Esophageal Dilatation Esophageal dilatation is a procedure to open a blocked or narrowed part of the esophagus. The esophagus is the long tube in your throat that carries food and liquid from your mouth to your stomach. The procedure is also called esophageal dilation.  You may need this procedure if you have a buildup of scar tissue in your esophagus that makes it difficult, painful, or even impossible to swallow. This can be caused by gastroesophageal reflux disease (GERD). In rare cases, people need this procedure because they have cancer of the esophagus or a problem with the way food moves through the esophagus. Sometimes you may need to have another dilatation to  enlarge the opening of the esophagus gradually. LET [facility] CARE PROVIDER KNOW ABOUT:   Any allergies you have.  All medicines you are taking, including vitamins, herbs, eye drops, creams, and over-the-counter medicines.  Previous problems you or members of your family have had with the use of anesthetics.  Any blood disorders you have.  Previous surgeries you have had.  Medical conditions you have.  Any antibiotic medicines you are required to take before dental procedures. RISKS AND COMPLICATIONS Generally, this is a safe procedure. However, problems can occur and include:  Bleeding from a tear in the lining of the esophagus.  A hole (perforation) in the esophagus. BEFORE THE PROCEDURE  Do not eat or drink anything after midnight on the night before the procedure or as directed by your health care provider.  Ask your health care provider about changing or stopping your regular medicines. This is especially important if you are taking diabetes medicines or blood thinners.  Plan to have someone take you home after the procedure. PROCEDURE   You will be given a medicine that makes you relaxed and sleepy (sedative).  A medicine may be sprayed or gargled to numb the back of the throat.  Your health care provider can use various instruments to do an esophageal dilatation. During the procedure, the instrument used will be placed in your mouth and passed down into your esophagus. Options include:  Simple dilators. This instrument is carefully placed in the esophagus to stretch it.  Guided wire bougies. In this method, a flexible tube (endoscope) is used to insert a wire into the esophagus. The dilator is passed over this wire to enlarge the esophagus. Then the wire is removed.  Balloon dilators. An endoscope with a small balloon at the end is passed down into the esophagus. Inflating the balloon gently stretches the esophagus and opens it up. AFTER THE PROCEDURE  Your blood  pressure, heart rate, breathing rate, and blood oxygen level will be monitored often until the medicines you were given have worn off.  Your throat may feel slightly sore and will probably still feel numb. This will improve slowly over time.  You will not be allowed to eat or drink until the throat numbness has resolved.  If this is a same-day procedure, you may be allowed to go home once you have been able to drink, urinate, and sit on the edge of the bed without nausea or dizziness.  If this is a same-day procedure, you should have a friend or family member with you for the next 24 hours after the procedure.   This information is not intended to replace advice given to you by your health care provider. Make sure you discuss any questions you have  with your health care provider.   Document Released: 07/30/2005 Document Revised: 06/29/2014 Document Reviewed: 10/18/2013 Elsevier Interactive Patient Education 2016 [employer]. Colonoscopy A colonoscopy is an exam to look at the entire large intestine (colon). This exam can help find problems such as tumors, polyps, inflammation, and areas of bleeding. The exam takes about 1 hour.  LET [facility] CARE PROVIDER KNOW ABOUT:   Any allergies you have.  All medicines you are taking, including vitamins, herbs, eye drops, creams, and over-the-counter medicines.  Previous problems you or members of your family have had with the use of anesthetics.  Any blood disorders you have.  Previous surgeries you have had.  Medical conditions you have. RISKS AND COMPLICATIONS  Generally, this is a safe procedure. However, as with any procedure, complications can occur. Possible complications include:  Bleeding.  Tearing or rupture of the colon wall.  Reaction to medicines given during the exam.  Infection (rare). BEFORE THE PROCEDURE   Ask your health care provider about changing or stopping your regular medicines.  You may be prescribed an  oral bowel prep. This involves drinking a large amount of medicated liquid, starting the day before your procedure. The liquid will cause you to have multiple loose stools until your stool is almost clear or light green. This cleans out your colon in preparation for the procedure.  Do not eat or drink anything else once you have started the bowel prep, unless your health care provider tells you it is safe to do so.  Arrange for someone to drive you home after the procedure. PROCEDURE   You will be given medicine to help you relax (sedative).  You will lie on your side with your knees bent.  A long, flexible tube with a light and camera on the end (colonoscope) will be inserted through the rectum and into the colon. The camera sends video back to a computer screen as it moves through the colon. The colonoscope also releases carbon dioxide gas to inflate the colon. This helps your health care provider see the area better.  During the exam, your health care provider may take a small tissue sample (biopsy) to be examined under a microscope if any abnormalities are found.  The exam is finished when the entire colon has been viewed. AFTER THE PROCEDURE   Do not drive for 24 hours after the exam.  You may have a small amount of blood in your stool.  You may pass moderate amounts of gas and have mild abdominal cramping or bloating. This is caused by the gas used to inflate your colon during the exam.  Ask when your test results will be ready and how you will get your results. Make sure you get your test results.   This information is not intended to replace advice given to you by your health care provider. Make sure you discuss any questions you have with your health care provider.   Document Released: 06/05/2000 Document Revised: 03/29/2013 Document Reviewed: 02/13/2013 Elsevier Interactive Patient Education 2016 [employer] Colonoscopy, Care After Refer to this sheet in the next few  weeks. These instructions provide you with information on caring for yourself after your procedure. Your health care provider may also give you more specific instructions. Your treatment has been planned according to current medical practices, but problems sometimes occur. Call your health care provider if you have any problems or questions after your procedure. WHAT TO EXPECT AFTER THE PROCEDURE  After your procedure, it is typical  to have the following:  A small amount of blood in your stool.  Moderate amounts of gas and mild abdominal cramping or bloating. HOME CARE INSTRUCTIONS  Do not drive, operate machinery, or sign important documents for 24 hours.  You may shower and resume your regular physical activities, but move at a slower pace for the first 24 hours.  Take frequent rest periods for the first 24 hours.  Walk around or put a warm pack on your abdomen to help reduce abdominal cramping and bloating.  Drink enough fluids to keep your urine clear or pale yellow.  You may resume your normal diet as instructed by your health care provider. Avoid heavy or fried foods that are hard to digest.  Avoid drinking alcohol for 24 hours or as instructed by your health care provider.  Only take over-the-counter or prescription medicines as directed by your health care provider.  If a tissue sample (biopsy) was taken during your procedure:  Do not take aspirin or blood thinners for 7 days, or as instructed by your health care provider.  Do not drink alcohol for 7 days, or as instructed by your health care provider.  Eat soft foods for the first 24 hours. SEEK MEDICAL CARE IF: You have persistent spotting of blood in your stool 2-3 days after the procedure. SEEK IMMEDIATE MEDICAL CARE IF:  You have more than a small spotting of blood in your stool.  You pass large blood clots in your stool.  Your abdomen is swollen (distended).  You have nausea or vomiting.  You have a  fever.  You have increasing abdominal pain that is not relieved with medicine.   This information is not intended to replace advice given to you by your health care provider. Make sure you discuss any questions you have with your health care provider.   Document Released: 01/21/2004 Document Revised: 03/29/2013 Document Reviewed: 02/13/2013 Elsevier Interactive Patient Education 2016 Else[employer] Inc. PATIENT INSTRUCTIONS POST-ANESTHESIA  IMMEDIATELY FOLLOWING SURGERY:  Do not drive or operate machinery for the first twenty four hours after surgery.  Do not make any important decisions for twenty four hours after surgery or while taking narcotic pain medications or sedatives.  If you develop intractable nausea and vomiting or a severe headache please notify your doctor immediately.  FOLLOW-UP:  Please make an appointment with your surgeon as instructed. You do not need to follow up with anesthesia unless specifically instructed to do so.  WOUND CARE INSTRUCTIONS (if applicable):  Keep a dry clean dressing on the anesthesia/puncture wound site if there is drainage.  Once the wound has quit draining you may leave it open to air.  Generally you should leave the bandage intact for twenty four hours unless there is drainage.  If the epidural site drains for more than 36-48 hours please call the anesthesia department.  QUESTIONS?:  Please feel free to call your physician or the hospital operator if you have any questions, and they will be happy to assist you.

## 2015-10-01 ENCOUNTER — Other Ambulatory Visit: Payer: Self-pay

## 2015-10-01 ENCOUNTER — Emergency Department (HOSPITAL_COMMUNITY)
Admission: EM | Admit: 2015-10-01 | Discharge: 2015-10-01 | Disposition: A | Discharge status: Home / Self Care | Payer: Medicare Other | Attending: Emergency Medicine | Admitting: Emergency Medicine

## 2015-10-01 ENCOUNTER — Encounter (HOSPITAL_COMMUNITY)
Admission: RE | Admit: 2015-10-01 | Discharge: 2015-10-01 | Disposition: A | Discharge status: Home / Self Care | Payer: Medicare Other | Source: Ambulatory Visit | Attending: Internal Medicine | Admitting: Internal Medicine

## 2015-10-01 ENCOUNTER — Encounter (HOSPITAL_COMMUNITY): Payer: Self-pay | Admitting: Emergency Medicine

## 2015-10-01 ENCOUNTER — Encounter (HOSPITAL_COMMUNITY): Payer: Self-pay

## 2015-10-01 DIAGNOSIS — Z01812 Encounter for preprocedural laboratory examination: Secondary | ICD-10-CM | POA: Insufficient documentation

## 2015-10-01 DIAGNOSIS — M545 Low back pain: Secondary | ICD-10-CM | POA: Diagnosis present

## 2015-10-01 DIAGNOSIS — G8929 Other chronic pain: Secondary | ICD-10-CM | POA: Diagnosis not present

## 2015-10-01 DIAGNOSIS — F329 Major depressive disorder, single episode, unspecified: Secondary | ICD-10-CM | POA: Insufficient documentation

## 2015-10-01 DIAGNOSIS — Z01810 Encounter for preprocedural cardiovascular examination: Secondary | ICD-10-CM | POA: Diagnosis present

## 2015-10-01 DIAGNOSIS — M5441 Lumbago with sciatica, right side: Secondary | ICD-10-CM | POA: Insufficient documentation

## 2015-10-01 DIAGNOSIS — I10 Essential (primary) hypertension: Secondary | ICD-10-CM | POA: Insufficient documentation

## 2015-10-01 DIAGNOSIS — M549 Dorsalgia, unspecified: Secondary | ICD-10-CM

## 2015-10-01 HISTORY — DX: Dorsopathy, unspecified: M53.9

## 2015-10-01 HISTORY — DX: Pure hypercholesterolemia, unspecified: E78.00

## 2015-10-01 LAB — BASIC METABOLIC PANEL
Anion gap: 10 (ref 5–15)
BUN: 9 mg/dL (ref 6–20)
CHLORIDE: 108 mmol/L (ref 101–111)
CO2: 22 mmol/L (ref 22–32)
Calcium: 9.3 mg/dL (ref 8.9–10.3)
Creatinine, Ser: 0.93 mg/dL (ref 0.61–1.24)
GFR calc Af Amer: >60 mL/min (ref >60)
GFR calc non Af Amer: >60 mL/min (ref >60)
GLUCOSE: 92 mg/dL (ref 65–99)
POTASSIUM: 3.9 mmol/L (ref 3.5–5.1)
Sodium: 140 mmol/L (ref 135–145)

## 2015-10-01 LAB — CBC WITH DIFFERENTIAL/PLATELET
Basophils Absolute: 0 10*3/uL (ref 0.0–0.1)
Basophils Relative: 0 %
Eosinophils Absolute: 0.2 10*3/uL (ref 0.0–0.7)
Eosinophils Relative: 2 %
HCT: 43.1 % (ref 39.0–52.0)
HEMOGLOBIN: 14.4 g/dL (ref 13.0–17.0)
LYMPHS ABS: 1.7 10*3/uL (ref 0.7–4.0)
LYMPHS PCT: 20 %
MCH: 28.7 pg (ref 26.0–34.0)
MCHC: 33.4 g/dL (ref 30.0–36.0)
MCV: 85.9 fL (ref 78.0–100.0)
Monocytes Absolute: 0.7 10*3/uL (ref 0.1–1.0)
Monocytes Relative: 8 %
NEUTROS ABS: 6.1 10*3/uL (ref 1.7–7.7)
NEUTROS PCT: 70 %
Platelets: 209 10*3/uL (ref 150–400)
RBC: 5.02 MIL/uL (ref 4.22–5.81)
RDW: 13.5 % (ref 11.5–15.5)
WBC: 8.6 10*3/uL (ref 4.0–10.5)

## 2015-10-01 MED ORDER — DIAZEPAM 5 MG/ML IJ SOLN
5.0000 mg | Freq: Once | INTRAMUSCULAR | Status: AC
  Administered 2015-10-01: 5 mg via INTRAMUSCULAR
  Filled 2015-10-01: qty 2

## 2015-10-01 MED ORDER — METHYLPREDNISOLONE 4 MG PO TBPK: ORAL_TABLET | ORAL | Status: DC

## 2015-10-01 MED ORDER — ACETAMINOPHEN 325 MG PO TABS
650.0000 mg | ORAL_TABLET | Freq: Once | ORAL | Status: AC
  Administered 2015-10-01: 650 mg via ORAL
  Filled 2015-10-01: qty 2

## 2015-10-01 MED ORDER — CYCLOBENZAPRINE HCL 10 MG PO TABS: 10.0000 mg | ORAL_TABLET | Freq: Three times a day (TID) | ORAL | Status: DC | PRN

## 2015-10-01 MED ORDER — ONDANSETRON 8 MG PO TBDP
8.0000 mg | ORAL_TABLET | Freq: Once | ORAL | Status: AC
  Administered 2015-10-01: 8 mg via ORAL
  Filled 2015-10-01: qty 1

## 2015-10-01 NOTE — Discharge Instructions (Signed)
Chronic Back Pain ° When back pain lasts longer than 3 months, it is called chronic back pain. People with chronic back pain often go through certain periods that are more intense (flare-ups).  °CAUSES °Chronic back pain can be caused by wear and tear (degeneration) on different structures in your back. These structures include: °· The bones of your spine (vertebrae) and the joints surrounding your spinal cord and nerve roots (facets). °· The strong, fibrous tissues that connect your vertebrae (ligaments). °Degeneration of these structures may result in pressure on your nerves. This can lead to constant pain. °HOME CARE INSTRUCTIONS °· Avoid bending, heavy lifting, prolonged sitting, and activities which make the problem worse. °· Take brief periods of rest throughout the day to reduce your pain. Lying down or standing usually is better than sitting while you are resting. °· Take over-the-counter or prescription medicines only as directed by your caregiver. °SEEK IMMEDIATE MEDICAL CARE IF:  °· You have weakness or numbness in one of your legs or feet. °· You have trouble controlling your bladder or bowels. °· You have nausea, vomiting, abdominal pain, shortness of breath, or fainting. °  °This information is not intended to replace advice given to you by your health care provider. Make sure you discuss any questions you have with your health care provider. °  °Document Released: 07/16/2004 Document Revised: 08/31/2011 Document Reviewed: 11/26/2014 °Elsevier Interactive Patient Education ©2016 Elsevier Inc. °Community Resource Guide Chronic Pain °The [employer]’s “211” is a great source of information about community services available.  Access by dialing 2-1-1 from anywhere in [place], or by website -  www.nc211.org.  ° °Other Local Resources (Updated 06/2015) ° ° °Chronic Pain °  °Services ° °  ° Phone Number and Address  °[facility] • Pain management is offered  by board-certified physician specialists, physical therapists, occupational therapists, and other professionals °• Support groups [phone] °[address], [address], [facility] [place] [facility] °, [place] [address]  ° [facility] Medicine • Various pain management strategies are offered, including medication, acupuncture, and manual therapy °• Must be referred by primary care doctor [phone] °[address], [address] °[place], [place]   °[facility] • Pain management clinic °• Must be referred by primary care doctor [phone] °[address], [address] °[place], [place]  °[facility] • Pain management is offered by board-certified physician specialists [phone] °[address], [address] °[place], [place] [address]  ° °

## 2015-10-01 NOTE — ED Notes (Addendum)
PT c/o lower back pain with radiation down right leg worsening x3 days with hx of same and hasn't been going to the pain clinic. PT states he called his primary MD and was told to come to ED for pain control and try to be re-enrolled in pain management. PT states he had one 10mg  Percocet this am.

## 2015-10-03 NOTE — ED Provider Notes (Signed)
CSN: [id number]     Arrival date & time 10/01/15  09/29/42 History   First MD Initiated Contact with Patient 10/01/15 1703     Chief Complaint  Patient presents with  . Back Pain     (Consider location/radiation/quality/duration/timing/severity/associated sxs/prior Treatment) HPI   Craig Drake is a 50 y.o. male who presents to the Emergency Department complaining of worsening of his chronic low back pain for 3 days.  He states that his primary doctor has referred him to pain management, but he has not attending.  PMD is arranging appt with another pain management clinic. He states he has taken one percocet earlier with minimal relief. He denies fever, chills, urine or bowel changes, abd pain, weakness of the extremities.      Past Medical History  Diagnosis Date  . Anxiety     and atypical chest pain  . Labile hypertension   . GERD (gastroesophageal reflux disease)   . Depression   . DDD (degenerative disc disease)   . Chronic back pain   . Hypercholesteremia   . Sleep apnea     does not wear CPAP  . Multilevel degenerative disc disease    Past Surgical History  Procedure Laterality Date  . Neck fusion    . Mandible fracture surgery      wired shut after a fall  . Knee arthroscopy Left    Family History  Problem Relation Age of Onset  . [name] Mother   . Hypertension Mother   . Lung cancer Father   . Colon cancer Mother 63    Passed away 09/29/2010 from stage IV CRC   Social History  Substance Use Topics  . Smoking status: Never Smoker   . Smokeless tobacco: Never Used     Comment: Never smoked  . Alcohol Use: No    Review of Systems  Constitutional: Negative for fever.  Respiratory: Negative for shortness of breath.   Gastrointestinal: Negative for vomiting, abdominal pain and constipation.  Genitourinary: Negative for dysuria, hematuria, flank pain, decreased urine volume and difficulty urinating.  Musculoskeletal: Positive for back pain. Negative for joint  swelling.  Skin: Negative for rash.  Neurological: Negative for weakness and numbness.  All other systems reviewed and are negative.     Allergies  Ibuprofen and Lyrica  Home Medications   Prior to Admission medications   Medication Sig Start Date End Date Taking? Authorizing Provider  acetaminophen-codeine (TYLENOL #3) 300-30 MG tablet TAKE 1 TABLET BY MOUTH EVERY 6 TO 8 HOURS AS NEEDED FOR PAIN 08/29/15   Historical Provider, MD  aspirin 81 MG tablet Take 81 mg by mouth daily.    Historical Provider, MD  atorvastatin (LIPITOR) 20 MG tablet TAKE 1 TABLET BY MOUTH AT BEDTIME FOR CHOLESTEROL 08/22/15   Historical Provider, MD  cabergoline (DOSTINEX) 0.5 MG tablet  07/23/15   Historical Provider, MD  cyclobenzaprine (FLEXERIL) 10 MG tablet Take 1 tablet (10 mg total) by mouth 3 (three) times daily as needed. 10/01/15   Jamella Grayer, PA-C  diazepam (VALIUM) 5 MG tablet Take 5 mg by mouth 2 (two) times daily.    Historical Provider, MD  Gabapentin & Lidocaine-Menthol (ACTIVE-PAC/GABAPENTIN) 300 & 4-1 MG & % THPK APPLY 1-2GM FOUR TIMES DAILY TO PAIN AREA (RUB IN WELL) 09/27/15   Historical Provider, MD  gabapentin (NEURONTIN) 300 MG capsule TAKE 1 CAPSULE BY MOUTH THREE TIMES DAILY FOR BACK AND NERVE PAIN 08/22/15   Historical Provider, MD  HYDROcodone-acetaminophen (NORCO/VICODIN) 5-325 MG tablet  TAKE 1 TABLET BY MOUTH EVERY 6 TO 8 HOURS AS NEEDED 09/24/15   Historical Provider, MD  lidocaine (XYLOCAINE) 5 % ointment Apply 1 application topically 2 (two) times daily as needed.    Historical Provider, MD  lisinopril (PRINIVIL,ZESTRIL) 20 MG tablet Take 20 mg by mouth daily. 09/27/15   Historical Provider, MD  methylPREDNISolone (MEDROL DOSEPAK) 4 MG TBPK tablet Taper 24 mg to 0 over 7 days. 10/01/15   Lindsey Hommel, PA-C  mirtazapine (REMERON) 15 MG tablet Take 15 mg by mouth at bedtime.    Historical Provider, MD  NON FORMULARY Vitamin D  50000 units  Once weekly    Historical Provider, MD   oxyCODONE-acetaminophen (PERCOCET) 10-325 MG tablet TAKE 1 TABLET BY MOUTH 4-6 HOURS AS NEEDED FOR SEVERE PAIN 09/04/15   Historical Provider, MD  pantoprazole (PROTONIX) 40 MG tablet Take 40 mg by mouth daily.    Historical Provider, MD  penicillin v potassium (VEETID) 500 MG tablet TAKE 1 TABLET BY MOUTH FOUR TIMES DAILY TILL GONE 09/16/15   Historical Provider, MD  polyethylene glycol-electrolytes (NULYTELY/GOLYTELY) 420 g solution Take 4,000 mLs by mouth once. 09/16/15   Carlis Stable, NP  sertraline (ZOLOFT) 100 MG tablet Take 100 mg by mouth daily. Takes one and a half tablets daily    Historical Provider, MD  simvastatin (ZOCOR) 20 MG tablet Take 20 mg by mouth at bedtime.    Historical Provider, MD  Vitamin D, Ergocalciferol, (DRISDOL) 50000 units CAPS capsule TAKE 1 CAPSULE BY MOUTH TWICE A WEEK 08/22/15   Historical Provider, MD   BP 133/75 mmHg  Pulse 70  Temp(Src) 98.2 F (36.8 C) (Oral)  Resp 18  Ht 6\' 2"  (1.88 m)  Wt 117.935 kg  BMI 33.37 kg/m2  SpO2 99% Physical Exam  Constitutional: He is oriented to person, place, and time. He appears well-developed and well-nourished. No distress.  HENT:  Head: Normocephalic and atraumatic.  Neck: Normal range of motion. Neck supple.  Cardiovascular: Normal rate, regular rhythm, normal heart sounds and intact distal pulses.   No murmur heard. Pulmonary/Chest: Effort normal and breath sounds normal. No respiratory distress.  Abdominal: Soft. He exhibits no distension. There is no tenderness.  Musculoskeletal: He exhibits tenderness. He exhibits no edema.       Lumbar back: He exhibits tenderness and pain. He exhibits normal range of motion, no swelling, no deformity, no laceration and normal pulse.  ttp of the right lumbar spine and paraspinal muscles.    DP pulses are brisk and symmetrical.  Distal sensation intact.  Pt has 5/5 strength against resistance of bilateral lower extremities.     Neurological: He is alert and oriented to person,  place, and time. He has normal strength. No sensory deficit. He exhibits normal muscle tone. Coordination and gait normal.  Reflex Scores:      Patellar reflexes are 2+ on the right side and 2+ on the left side.      Achilles reflexes are 2+ on the right side and 2+ on the left side. Skin: Skin is warm and dry. No rash noted.  Nursing note and vitals reviewed.   ED Course  Procedures (including critical care time) Labs Review Labs Reviewed - No data to display  Imaging Review No results found. I have personally reviewed and evaluated these images and lab results as part of my medical decision-making.   EKG Interpretation None      MDM   Final diagnoses:  Chronic back pain  Patient with acute on chronic low back pain with right sciatica. Had 30 day supply of oxycodone filled by PMD on 09/04/15 and also received hydrocodone one day prior to this visit per [place] narcotic database.    I have consulted patient's PMD, Dr. Scotty Court.  Pt has been referred to pain management.  I have discussed this with patient and he understands that further narcotics are not indicated.  No concerning sx's for emergent neurological process. Advised pt f/u with pain management.  He appears stable for d/c   Kem Parkinson, PA-C 10/03/15 2344  Virgel Manifold, MD 10/04/15 1053

## 2015-10-07 ENCOUNTER — Ambulatory Visit (HOSPITAL_COMMUNITY): Payer: Medicare Other | Admitting: Anesthesiology

## 2015-10-07 ENCOUNTER — Ambulatory Visit (HOSPITAL_COMMUNITY)
Admission: RE | Admit: 2015-10-07 | Discharge: 2015-10-07 | Disposition: A | Discharge status: Home / Self Care | Payer: Medicare Other | Source: Ambulatory Visit | Attending: Internal Medicine | Admitting: Internal Medicine

## 2015-10-07 ENCOUNTER — Encounter (HOSPITAL_COMMUNITY)
Admission: RE | Disposition: A | Discharge status: Home / Self Care | Payer: Self-pay | Source: Ambulatory Visit | Attending: Internal Medicine

## 2015-10-07 ENCOUNTER — Encounter (HOSPITAL_COMMUNITY): Payer: Self-pay | Admitting: *Deleted

## 2015-10-07 DIAGNOSIS — K209 Esophagitis, unspecified: Secondary | ICD-10-CM

## 2015-10-07 DIAGNOSIS — Z79899 Other long term (current) drug therapy: Secondary | ICD-10-CM | POA: Insufficient documentation

## 2015-10-07 DIAGNOSIS — Z7982 Long term (current) use of aspirin: Secondary | ICD-10-CM | POA: Diagnosis not present

## 2015-10-07 DIAGNOSIS — K219 Gastro-esophageal reflux disease without esophagitis: Secondary | ICD-10-CM | POA: Diagnosis not present

## 2015-10-07 DIAGNOSIS — G4730 Sleep apnea, unspecified: Secondary | ICD-10-CM | POA: Diagnosis not present

## 2015-10-07 DIAGNOSIS — K229 Disease of esophagus, unspecified: Secondary | ICD-10-CM | POA: Diagnosis not present

## 2015-10-07 DIAGNOSIS — K641 Second degree hemorrhoids: Secondary | ICD-10-CM | POA: Diagnosis not present

## 2015-10-07 DIAGNOSIS — I10 Essential (primary) hypertension: Secondary | ICD-10-CM | POA: Insufficient documentation

## 2015-10-07 DIAGNOSIS — M549 Dorsalgia, unspecified: Secondary | ICD-10-CM | POA: Insufficient documentation

## 2015-10-07 DIAGNOSIS — K640 First degree hemorrhoids: Secondary | ICD-10-CM | POA: Diagnosis not present

## 2015-10-07 DIAGNOSIS — R1310 Dysphagia, unspecified: Secondary | ICD-10-CM | POA: Diagnosis not present

## 2015-10-07 DIAGNOSIS — F419 Anxiety disorder, unspecified: Secondary | ICD-10-CM | POA: Insufficient documentation

## 2015-10-07 DIAGNOSIS — K5730 Diverticulosis of large intestine without perforation or abscess without bleeding: Secondary | ICD-10-CM | POA: Diagnosis not present

## 2015-10-07 DIAGNOSIS — F329 Major depressive disorder, single episode, unspecified: Secondary | ICD-10-CM | POA: Insufficient documentation

## 2015-10-07 DIAGNOSIS — K921 Melena: Secondary | ICD-10-CM | POA: Insufficient documentation

## 2015-10-07 DIAGNOSIS — K449 Diaphragmatic hernia without obstruction or gangrene: Secondary | ICD-10-CM | POA: Insufficient documentation

## 2015-10-07 DIAGNOSIS — G8929 Other chronic pain: Secondary | ICD-10-CM | POA: Insufficient documentation

## 2015-10-07 DIAGNOSIS — K2289 Other specified disease of esophagus: Secondary | ICD-10-CM | POA: Insufficient documentation

## 2015-10-07 DIAGNOSIS — K625 Hemorrhage of anus and rectum: Secondary | ICD-10-CM | POA: Diagnosis present

## 2015-10-07 HISTORY — PX: ESOPHAGOGASTRODUODENOSCOPY (EGD) WITH PROPOFOL: SHX5813

## 2015-10-07 HISTORY — PX: MALONEY DILATION: SHX5535

## 2015-10-07 HISTORY — PX: COLONOSCOPY WITH PROPOFOL: SHX5780

## 2015-10-07 HISTORY — PX: BIOPSY: SHX5522

## 2015-10-07 SURGERY — COLONOSCOPY WITH PROPOFOL
Anesthesia: Monitor Anesthesia Care

## 2015-10-07 MED ORDER — MIDAZOLAM HCL 2 MG/2ML IJ SOLN
INTRAMUSCULAR | Status: AC
  Filled 2015-10-07: qty 2

## 2015-10-07 MED ORDER — ONDANSETRON HCL 4 MG/2ML IJ SOLN
4.0000 mg | Freq: Once | INTRAMUSCULAR | Status: AC
  Administered 2015-10-07: 4 mg via INTRAVENOUS

## 2015-10-07 MED ORDER — ONDANSETRON HCL 4 MG/2ML IJ SOLN
INTRAMUSCULAR | Status: AC
  Filled 2015-10-07: qty 2

## 2015-10-07 MED ORDER — PROPOFOL 10 MG/ML IV BOLUS
INTRAVENOUS | Status: DC | PRN
  Administered 2015-10-07: 10 mg via INTRAVENOUS
  Administered 2015-10-07: 20 mg via INTRAVENOUS

## 2015-10-07 MED ORDER — ONDANSETRON HCL 4 MG/2ML IJ SOLN: 4.0000 mg | Freq: Once | INTRAMUSCULAR | Status: DC | PRN

## 2015-10-07 MED ORDER — FENTANYL CITRATE (PF) 100 MCG/2ML IJ SOLN
25.0000 ug | INTRAMUSCULAR | Status: AC
  Administered 2015-10-07 (×2): 25 ug via INTRAVENOUS

## 2015-10-07 MED ORDER — MIDAZOLAM HCL 2 MG/2ML IJ SOLN
1.0000 mg | INTRAMUSCULAR | Status: DC | PRN
  Administered 2015-10-07 (×3): 2 mg via INTRAVENOUS
  Filled 2015-10-07 (×2): qty 2

## 2015-10-07 MED ORDER — LACTATED RINGERS IV SOLN
INTRAVENOUS | Status: DC
  Administered 2015-10-07: 07:00:00 via INTRAVENOUS

## 2015-10-07 MED ORDER — FENTANYL CITRATE (PF) 100 MCG/2ML IJ SOLN: 25.0000 ug | INTRAMUSCULAR | Status: DC | PRN

## 2015-10-07 MED ORDER — LIDOCAINE VISCOUS 2 % MT SOLN
OROMUCOSAL | Status: AC
  Filled 2015-10-07: qty 15

## 2015-10-07 MED ORDER — PROPOFOL 10 MG/ML IV BOLUS
INTRAVENOUS | Status: AC
  Filled 2015-10-07: qty 40

## 2015-10-07 MED ORDER — PROPOFOL 10 MG/ML IV BOLUS
INTRAVENOUS | Status: AC
  Filled 2015-10-07: qty 20

## 2015-10-07 MED ORDER — LIDOCAINE VISCOUS 2 % MT SOLN
15.0000 mL | Freq: Once | OROMUCOSAL | Status: AC
  Administered 2015-10-07: 6 mL via OROMUCOSAL

## 2015-10-07 MED ORDER — LIDOCAINE HCL (PF) 1 % IJ SOLN
INTRAMUSCULAR | Status: AC
  Filled 2015-10-07: qty 5

## 2015-10-07 MED ORDER — FENTANYL CITRATE (PF) 100 MCG/2ML IJ SOLN
INTRAMUSCULAR | Status: AC
  Filled 2015-10-07: qty 2

## 2015-10-07 MED ORDER — PROPOFOL 500 MG/50ML IV EMUL
INTRAVENOUS | Status: DC | PRN
  Administered 2015-10-07: 08:00:00 via INTRAVENOUS
  Administered 2015-10-07: 125 ug/kg/min via INTRAVENOUS

## 2015-10-07 NOTE — Op Note (Signed)
[facility] Patient Name: Craig Drake Procedure Date: 10/07/2015 8:01 AM MRN: [id number] Date of Birth: 19-Aug-1965 Attending MD: Norvel Richards , MD CSN: [id number] Age: 50 Admit Type: Outpatient Procedure:                Colonoscopy Indications:              Hematochezia Providers:                Norvel Richards, MD, Gwenlyn Fudge, RN, Georgeann Oppenheim, Technician Referring MD:              Medicines:                Monitored Anesthesia Care Complications:            No immediate complications. Estimated Blood Loss:     Estimated blood loss: none. Procedure:                Pre-Anesthesia Assessment:                           - Prior to the procedure, a History and Physical                            was performed, and patient medications and                            allergies were reviewed. The patient's tolerance of                            previous anesthesia was also reviewed. The risks                            and benefits of the procedure and the sedation                            options and risks were discussed with the patient.                            All questions were answered, and informed consent                            was obtained. Prior Anticoagulants: The patient                            last took aspirin on the day of the procedure. ASA                            Grade Assessment: II - A patient with mild systemic                            disease. After reviewing the risks and benefits,  the patient was deemed in satisfactory condition to                            undergo the procedure.                           - Prior to the procedure, a History and Physical                            was performed, and patient medications and                            allergies were reviewed. The patient's tolerance of                            previous anesthesia was also reviewed. The risks                             and benefits of the procedure and the sedation                            options and risks were discussed with the patient.                            All questions were answered, and informed consent                            was obtained. ASA Grade Assessment: II - A patient                            with mild systemic disease. After reviewing the                            risks and benefits, the patient was deemed in                            satisfactory condition to undergo the procedure.                           After obtaining informed consent, the colonoscope                            was passed under direct vision. Throughout the                            procedure, the patient's blood pressure, pulse, and                            oxygen saturations were monitored continuously. The                            EC-[id number] scope was introduced through  the anus and advanced to the the cecum, identified                            by appendiceal orifice and ileocecal valve. The                            colonoscopy was performed without difficulty. The                            patient tolerated the procedure well. The quality                            of the bowel preparation was adequate. The                            ileocecal valve, appendiceal orifice, and rectum                            were photographed. Scope In: 8:02:53 AM Scope Out: 8:14:40 AM Scope Withdrawal Time: 0 hours 6 minutes 51 seconds  Total Procedure Duration: 0 hours 11 minutes 47 seconds  Findings:      Scattered medium-mouthed diverticula were found in the entire colon.       Estimated blood loss: none.      Internal hemorrhoids were found during retroflexion. The hemorrhoids       were Grade II (internal hemorrhoids that prolapse but reduce       spontaneously).      The exam was otherwise without abnormality on direct and  retroflexion       views. Impression:               - Diverticulosis in the entire examined colon.                           - Internal hemorrhoids.                           - The examination was otherwise normal on direct                            and retroflexion views.                           - No specimens collected. Moderate Sedation:      Moderate (conscious) sedation was personally administered by an       anesthesia professional. The following parameters were monitored: oxygen       saturation, heart rate, blood pressure, respiratory rate, EKG, adequacy       of pulmonary ventilation, and response to care. Total physician       intraservice time was 34 minutes. Recommendation:           - Patient has a contact number available for                            emergencies. The signs and symptoms of potential  delayed complications were discussed with the                            patient. Return to normal activities tomorrow.                            Written discharge instructions were provided to the                            patient.                           - Continue present medications.                           - Repeat colonoscopy in 5 years for screening                            purposes.                           - Return to GI office in 6 weeks.                           - Advance diet as tolerated. Procedure Code(s):        --- Professional ---                           [phone], Colonoscopy, flexible; diagnostic, including                            collection of specimen(s) by brushing or washing,                            when performed (separate procedure) Diagnosis Code(s):        --- Professional ---                           K64.1, Second degree hemorrhoids                           K92.1, Melena (includes Hematochezia)                           K57.30, Diverticulosis of large intestine without                             perforation or abscess without bleeding CPT copyright 2016 [employer]. All rights reserved. The codes documented in this report are preliminary and upon coder review may  be revised to meet current compliance requirements. [name]. Marian Grandt, MD Norvel Richards, MD 10/07/2015 8:32:51 AM This report has been signed electronically. Number of Addenda: 0

## 2015-10-07 NOTE — Transfer of Care (Signed)
Immediate Anesthesia Transfer of Care Note  Patient: Craig Drake  Procedure(s) Performed: Procedure(s) with comments: COLONOSCOPY WITH PROPOFOL (N/A) - 0730 ESOPHAGOGASTRODUODENOSCOPY (EGD) WITH PROPOFOL (N/A) MALONEY DILATION (N/A) BIOPSY - esophageal bx;   Patient Location: PACU  Anesthesia Type:MAC  Level of Consciousness: awake and alert   Airway & Oxygen Therapy: Patient Spontanous Breathing  Post-op Assessment: Report given to RN  Post vital signs: Reviewed and stable  Last Vitals:  Filed Vitals:   10/07/15 0725 10/07/15 0730  BP: 122/94   Temp:    Resp: 15 25    Complications: No apparent anesthesia complications

## 2015-10-07 NOTE — Anesthesia Preprocedure Evaluation (Signed)
Anesthesia Evaluation  Patient identified by MRN, date of birth, ID band Patient awake    Reviewed: Allergy & Precautions, NPO status , Patient's Chart, lab work & pertinent test results  Airway Mallampati: II  TM Distance: >3 FB Neck ROM: Limited    Dental  (+) Partial Lower, Partial Upper   Pulmonary sleep apnea ,    breath sounds clear to auscultation       Cardiovascular hypertension, Pt. on medications  Rhythm:Regular Rate:Normal     Neuro/Psych PSYCHIATRIC DISORDERS Anxiety Depression    GI/Hepatic GERD  ,  Endo/Other    Renal/GU      Musculoskeletal   Abdominal   Peds  Hematology   Anesthesia Other Findings CHRONIC PAIN SYNDROME  Reproductive/Obstetrics                             Anesthesia Physical Anesthesia Plan  ASA: III  Anesthesia Plan: MAC   Post-op Pain Management:    Induction: Intravenous  Airway Management Planned: Simple Face Mask  Additional Equipment:   Intra-op Plan:   Post-operative Plan:   Informed Consent: I have reviewed the patients History and Physical, chart, labs and discussed the procedure including the risks, benefits and alternatives for the proposed anesthesia with the patient or authorized representative who has indicated his/her understanding and acceptance.     Plan Discussed with:   Anesthesia Plan Comments:         Anesthesia Quick Evaluation

## 2015-10-07 NOTE — H&P (View-Only) (Signed)
Primary Care Physician:  Deloria Lair, MD Primary Gastroenterologist:  Dr. Gala Romney  Chief Complaint  Patient presents with  . Rectal Bleeding  . Dysphagia  . Gastroesophageal Reflux    HPI:   Craig Drake is a 50 y.o. male who presents on Referral from primary care for rectal bleeding, GERD, dysphagia. Last saw PCP on 08/21/2015. He has multiple medical issues including pituitary microadenoma, history of GERD, history of dysphagia status post dilation, back problems. Labs included with PCP note found normal hemoglobin and hematocrit, alkaline phosphatase mildly elevated at 99 otherwise normal liver function, normal kidney function.  He had an EGD and colonoscopy completed on 07/17/2010 also for dysphagia, nausea, reflux symptoms, history of known Schatzki's ring as well as rectal bleeding. EGD findings included a distal esophageal circumferential erosions within 2 cm the GE junction, noncritical stricture, status post Maloney dilation and biopsy, small hiatal hernia, antral erosions status post biopsy. Colonoscopy found internal hemorrhoids, otherwise normal rectum. Biopsies found reactive gastropathy without H. 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 Recommended stop omeprazole and begin Dexilant daily, Carafate 1 g 4 times a day for 5 days, fiber supplement.  Today he states he is having dysphagia symptoms again, has to consciously "relax his throat" for food to pass or he will have to regurgitate. Symptoms about once a week. Last dilation worked well for him symptomatically. No pill dysphagia. GERD still with breakthrough symptoms 1-2 times a month, aggravated by stress or worsening back trouble. Takes Prilosec daily as well as Protonix OTC as well. Had one episode of rectal bleeding states about a half a cup, felt weakness afterward. Had one episode and none since. Has a  history of internal hemorrhoids. No other hemorrhoid symptoms. Has LLQ abdominal pain, occasional nausea. Denies vomiting, unintentional weight loss, fever, chills. Does have night sweats "only in my head." Poor sleep, is on medication to help. Has rare chest pain and was seen by cardiology with stress test done and told pain is non-cardiac, likely anxiety. Denies dyspnea, dizziness, lightheadedness, syncope, near syncope. Denies any other upper or lower GI symptoms.  Past Medical History  Diagnosis Date  . Anxiety     and atypical chest pain  . Labile hypertension   . GERD (gastroesophageal reflux disease)   . Sleep apnea     does not wear CPAP  . Depression   . DDD (degenerative disc disease)   . Chronic back pain     Past Surgical History  Procedure Laterality Date  . Neck fusion    . Mandible fracture surgery      wired shut after a fall    Current Outpatient Prescriptions  Medication Sig Dispense Refill  . aspirin 81 MG tablet Take 81 mg by mouth daily.    . diazepam (VALIUM) 5 MG tablet Take 5 mg by mouth 2 (two) times daily.    Marland Kitchen HYDROcodone-acetaminophen (NORCO) 10-325 MG tablet Take 1 tablet by mouth every 6 (six) hours as needed.    . lidocaine (XYLOCAINE) 5 % ointment Apply 1 application topically 2 (two) times daily as needed.    Marland Kitchen lisinopril (PRINIVIL,ZESTRIL) 10 MG tablet Take 20 mg by mouth daily.     . mirtazapine (REMERON) 15 MG tablet Take 15 mg by mouth at bedtime.    . NON FORMULARY Vitamin D  50000 units  Once weekly    . pantoprazole (PROTONIX) 40 MG tablet  Take 40 mg by mouth daily.    . sertraline (ZOLOFT) 100 MG tablet Take 100 mg by mouth daily. Takes one and a half tablets daily    . simvastatin (ZOCOR) 20 MG tablet Take 20 mg by mouth at bedtime.     No current facility-administered medications for this visit.    Allergies as of 09/12/2015 - Review Complete 09/12/2015  Allergen Reaction Noted  . Ibuprofen  09/12/2015  . Lyrica [pregabalin]   01/11/2012    Family History  Problem Relation Age of Onset  . [name] Mother   . Hypertension Mother   . Lung cancer Father   . Colon cancer Mother 76    Passed away 2010-10-20 from stage IV CRC    Social History   Social History  . Marital Status: Divorced    Spouse Name: N/A  . Number of Children: N/A  . Years of Education: N/A   Occupational History  . Not on file.   Social History Main Topics  . Smoking status: Never Smoker   . Smokeless tobacco: Never Used     Comment: Never smoked  . Alcohol Use: No  . Drug Use: No  . Sexual Activity: Not on file   Other Topics Concern  . Not on file   Social History Narrative    Review of Systems: General: Negative for anorexia, weight loss, fever, chills, fatigue, weakness. Eyes: Negative for vision changes.  ENT: Negative for hoarseness. CV: Negative for chest pain, angina, palpitations, peripheral edema.  Respiratory: Negative for dyspnea at rest, cough, sputum, wheezing.  GI: See history of present illness. MS: Admits chronic low back pain.  Endo: Negative for unusual weight change.  Heme: Negative for bruising or bleeding. Allergy: Negative for rash or hives.    Physical Exam: BP 131/88 mmHg  Pulse 82  Temp(Src) 97.4 F (36.3 C) (Oral)  Ht 6\' 2"  (1.88 m)  Wt 266 lb 6.4 oz (120.838 kg)  BMI 34.19 kg/m2 General:   Alert and oriented. Pleasant and cooperative. Well-nourished and well-developed.  Head:  Normocephalic and atraumatic. Eyes:  Without icterus, sclera clear and conjunctiva pink.  Ears:  Normal auditory acuity. Cardiovascular:  S1, S2 present without murmurs appreciated. Extremities without clubbing or edema. Respiratory:  Clear to auscultation bilaterally. No wheezes, rales, or rhonchi. No distress.  Gastrointestinal:  +BS, rounded but soft, and non-distended. Mile LLQ TTP. No guarding or rebound.  Rectal:  Deferred  Musculoskalatal:  Ambulates with a cane. Skin:  Intact without significant lesions or  rashes. Neurologic:  Alert and oriented x4;  grossly normal neurologically. Psych:  Alert and cooperative. Normal mood and affect. Heme/Lymph/Immune: No excessive bruising noted.    09/13/2015 4:29 PM   Disclaimer: This note was dictated with voice recognition software. Similar sounding words can inadvertently be transcribed and may not be corrected upon review.

## 2015-10-07 NOTE — Interval H&P Note (Signed)
History and Physical Interval Note:  10/07/2015 7:32 AM  Craig Drake  has presented today for surgery, with the diagnosis of dysphagia, GERD, rectal bleeding  The various methods of treatment have been discussed with the patient and family. After consideration of risks, benefits and other options for treatment, the patient has consented to  Procedure(s) with comments: COLONOSCOPY WITH PROPOFOL (N/A) - 0730 ESOPHAGOGASTRODUODENOSCOPY (EGD) WITH PROPOFOL (N/A) MALONEY DILATION (N/A) as a surgical intervention .  The patient's history has been reviewed, patient examined, no change in status, stable for surgery.  I have reviewed the patient's chart and labs.  Questions were answered to the patient's satisfaction.     Teva Bronkema  No change; EGD/ED and TCS per plan. The risks, benefits, limitations, imponderables and alternatives regarding both EGD and colonoscopy have been reviewed with the patient. Questions have been answered. All parties agreeable.

## 2015-10-07 NOTE — Discharge Instructions (Signed)
EGD Discharge instructions Please read the instructions outlined below and refer to this sheet in the next few weeks. These discharge instructions provide you with general information on caring for yourself after you leave the hospital. Your doctor may also give you specific instructions. While your treatment has been planned according to the most current medical practices available, unavoidable complications occasionally occur. If you have any problems or questions after discharge, please call your doctor. ACTIVITY  You may resume your regular activity but move at a slower pace for the next 24 hours.   Take frequent rest periods for the next 24 hours.   Walking will help expel (get rid of) the air and reduce the bloated feeling in your abdomen.   No driving for 24 hours (because of the anesthesia (medicine) used during the test).   You may shower.   Do not sign any important legal documents or operate any machinery for 24 hours (because of the anesthesia used during the test).  NUTRITION  Drink plenty of fluids.   You may resume your normal diet.   Begin with a light meal and progress to your normal diet.   Avoid alcoholic beverages for 24 hours or as instructed by your caregiver.  MEDICATIONS  You may resume your normal medications unless your caregiver tells you otherwise.  WHAT YOU CAN EXPECT TODAY  You may experience abdominal discomfort such as a feeling of fullness or gas pains.  FOLLOW-UP  Your doctor will discuss the results of your test with you.  SEEK IMMEDIATE MEDICAL ATTENTION IF ANY OF THE FOLLOWING OCCUR:  Excessive nausea (feeling sick to your stomach) and/or vomiting.   Severe abdominal pain and distention (swelling).   Trouble swallowing.   Temperature over 101 F (37.8 C).   Rectal bleeding or vomiting of blood.   Colonoscopy Discharge Instructions  Read the instructions outlined below and refer to this sheet in the next few weeks. These  discharge instructions provide you with general information on caring for yourself after you leave the hospital. Your doctor may also give you specific instructions. While your treatment has been planned according to the most current medical practices available, unavoidable complications occasionally occur. If you have any problems or questions after discharge, call Dr. Gala Romney at [phone]. ACTIVITY  You may resume your regular activity, but move at a slower pace for the next 24 hours.   Take frequent rest periods for the next 24 hours.   Walking will help get rid of the air and reduce the bloated feeling in your belly (abdomen).   No driving for 24 hours (because of the medicine (anesthesia) used during the test).    Do not sign any important legal documents or operate any machinery for 24 hours (because of the anesthesia used during the test).  NUTRITION  Drink plenty of fluids.   You may resume your normal diet as instructed by your doctor.   Begin with a light meal and progress to your normal diet. Heavy or fried foods are harder to digest and may make you feel sick to your stomach (nauseated).   Avoid alcoholic beverages for 24 hours or as instructed.  MEDICATIONS  You may resume your normal medications unless your doctor tells you otherwise.  WHAT YOU CAN EXPECT TODAY  Some feelings of bloating in the abdomen.   Passage of more gas than usual.   Spotting of blood in your stool or on the toilet paper.  IF YOU HAD POLYPS REMOVED DURING THE COLONOSCOPY:  No aspirin products for 7 days or as instructed.   No alcohol for 7 days or as instructed.   Eat a soft diet for the next 24 hours.  FINDING OUT THE RESULTS OF YOUR TEST Not all test results are available during your visit. If your test results are not back during the visit, make an appointment with your caregiver to find out the results. Do not assume everything is normal if you have not heard from your caregiver or the  medical facility. It is important for you to follow up on all of your test results.  SEEK IMMEDIATE MEDICAL ATTENTION IF:  You have more than a spotting of blood in your stool.   Your belly is swollen (abdominal distention).   You are nauseated or vomiting.   You have a temperature over 101.   You have abdominal pain or discomfort that is severe or gets worse throughout the day.    MRN diverticulosis information provided  Repeat colonoscopy in 5 years  Office visit with [place] in 4-6 weeks for hemorrhoid banding  Further recommendations to follow pending review of pathology report Diverticulosis Diverticulosis is the condition that develops when small pouches (diverticula) form in the wall of your colon. Your colon, or large intestine, is where water is absorbed and stool is formed. The pouches form when the inside layer of your colon pushes through weak spots in the outer layers of your colon. CAUSES  No one knows exactly what causes diverticulosis. RISK FACTORS  Being older than 50. Your risk for this condition increases with age. Diverticulosis is rare in people younger than 40 years. By age 54, almost everyone has it.  Eating a low-fiber diet.  Being frequently constipated.  Being overweight.  Not getting enough exercise.  Smoking.  Taking over-the-counter pain medicines, like aspirin and ibuprofen. SYMPTOMS  Most people with diverticulosis do not have symptoms. DIAGNOSIS  Because diverticulosis often has no symptoms, health care providers often discover the condition during an exam for other colon problems. In many cases, a health care provider will diagnose diverticulosis while using a flexible scope to examine the colon (colonoscopy). TREATMENT  If you have never developed an infection related to diverticulosis, you may not need treatment. If you have had an infection before, treatment may include:  Eating more fruits, vegetables, and grains.  Taking a fiber  supplement.  Taking a live bacteria supplement (probiotic).  Taking medicine to relax your colon. HOME CARE INSTRUCTIONS   Drink at least 6-8 glasses of water each day to prevent constipation.  Try not to strain when you have a bowel movement.  Keep all follow-up appointments. If you have had an infection before:  Increase the fiber in your diet as directed by your health care provider or dietitian.  Take a dietary fiber supplement if your health care provider approves.  Only take medicines as directed by your health care provider. SEEK MEDICAL CARE IF:   You have abdominal pain.  You have bloating.  You have cramps.  You have not gone to the bathroom in 3 days. SEEK IMMEDIATE MEDICAL CARE IF:   Your pain gets worse.  Yourbloating becomes very bad.  You have a fever or chills, and your symptoms suddenly get worse.  You begin vomiting.  You have bowel movements that are bloody or black. MAKE SURE YOU:  Understand these instructions.  Will watch your condition.  Will get help right away if you are not doing well or get worse.  This information is not intended to replace advice given to you by your health care provider. Make sure you discuss any questions you have with your health care provider.   Document Released: 03/05/2004 Document Revised: 06/13/2013 Document Reviewed: 05/03/2013 Elsevier Interactive Patient Education 2016 Elsevier Inc. Nonsurgical Procedures for Hemorrhoids Nonsurgical procedures can be used to treat hemorrhoids. Hemorrhoids are swollen veins that are inside the rectum (internal hemorrhoids) or around the anus (external hemorrhoids). They are caused by increased pressure in the anal area. This pressure may result from straining to have a bowel movement (constipation), diarrhea, pregnancy, obesity, anal sex, or sitting for long periods of time. Hemorrhoids can cause symptoms such as pain and bleeding. Various procedures may be performed if  diet changes, lifestyle changes, and other treatments do not help your symptoms. Some of these procedures do not involve surgery. Three common nonsurgical procedures are:  Rubber band ligation. Rubber bands are used to cut off the blood supply to the hemorrhoids.  Sclerotherapy. Medicine is injected into the hemorrhoids to shrink them.  Infrared coagulation. A type of light energy is used to get rid of the hemorrhoids. LET [facility] CARE PROVIDER KNOW ABOUT:  Any allergies you have.  All medicines you are taking, including vitamins, herbs, eye drops, creams, and over-the-counter medicines.  Previous problems you or members of your family have had with the use of anesthetics.  Any blood disorders you have.  Previous surgeries you have had.  Any medical conditions you have.  Whether you are pregnant or may be pregnant. RISKS AND COMPLICATIONS Generally, this is a safe procedure. However, problems may occur, including:  Infection.  Bleeding.  Pain. BEFORE THE PROCEDURE  Ask your health care provider about:  Changing or stopping your regular medicines. This is especially important if you are taking diabetes medicines or blood thinners.  Taking medicines such as aspirin and ibuprofen. These medicines can thin your blood. Do not take these medicines before your procedure if your health care provider instructs you not to.  You may need to have a procedure to examine the inside of your colon with a scope (colonoscopy). Your health care provider may do this to make sure that there are no other causes for your bleeding or pain. PROCEDURE  Your health care provider will clean your rectal area with a rinsing solution.  A lubricating jelly may be placed into your rectum. The jelly may contain a medicine to numb the area (local anesthetic).  Your health care provider will insert a short scope (anoscope) into your rectum to examine the hemorrhoids.  One of the following techniques  will be used. Rubber Band Ligation Your health care provider will place medical instruments through the scope to put rubber bands around the base of your hemorrhoids. The bands will cut off the blood supply to the hemorrhoids. The hemorrhoids will fall off after several days. Sclerotherapy Your health care provider will inject medicine through the scope into your hemorrhoids. This will cause them to shrink and dry up. Infrared Coagulation Your health care provider will shine a type of light through the scope onto your hemorrhoids. This light will generate energy (infrared radiation). It will cause the hemorrhoids to scar and then fall off. Each of these procedures may vary among health care providers and hospitals. AFTER THE PROCEDURE  You will be monitored to make sure that you have no bleeding.  Return to your normal activities as told by your health care provider.   This information is not  intended to replace advice given to you by your health care provider. Make sure you discuss any questions you have with your health care provider.   Document Released: 04/05/2009 Document Revised: 02/27/2015 Document Reviewed: 09/03/2014 Elsevier Interactive Patient Education [employer].

## 2015-10-07 NOTE — Anesthesia Postprocedure Evaluation (Signed)
Anesthesia Post Note  Patient: Craig Drake  Procedure(s) Performed: Procedure(s) (LRB): COLONOSCOPY WITH PROPOFOL (N/A) ESOPHAGOGASTRODUODENOSCOPY (EGD) WITH PROPOFOL (N/A) MALONEY DILATION (N/A) BIOPSY  Patient location during evaluation: PACU Anesthesia Type: MAC Level of consciousness: awake and alert Pain management: pain level controlled Vital Signs Assessment: post-procedure vital signs reviewed and stable Respiratory status: spontaneous breathing Cardiovascular status: blood pressure returned to baseline Postop Assessment: no signs of nausea or vomiting Anesthetic complications: no    Last Vitals:  Filed Vitals:   10/07/15 0725 10/07/15 0730  BP: 122/94   Temp:    Resp: 15 25    Last Pain:  Filed Vitals:   10/07/15 0824  PainSc: 8                  [name]

## 2015-10-07 NOTE — Op Note (Signed)
[facility] Patient Name: Craig Drake Procedure Date: 10/07/2015 7:35 AM MRN: [id number] Date of Birth: 1966/01/05 Attending MD: Norvel Richards , MD CSN: [id number] Age: 50 Admit Type: Outpatient Procedure:                Upper GI endoscopy Indications:              Dysphagia Providers:                Norvel Richards, MD, Gwenlyn Fudge, RN, Georgeann Oppenheim, Technician Referring MD:              Medicines:                Monitored Anesthesia Care Complications:            No immediate complications. Estimated Blood Loss:     Estimated blood loss was minimal. Procedure:                Pre-Anesthesia Assessment:                           - Prior to the procedure, a History and Physical                            was performed, and patient medications and                            allergies were reviewed. The patient's tolerance of                            previous anesthesia was also reviewed. The risks                            and benefits of the procedure and the sedation                            options and risks were discussed with the patient.                            All questions were answered, and informed consent                            was obtained. Prior Anticoagulants: The patient                            last took aspirin on the day of the procedure. ASA                            Grade Assessment: II - A patient with mild systemic                            disease. After reviewing the risks and benefits,  the patient was deemed in satisfactory condition to                            undergo the procedure.                           After obtaining informed consent, the endoscope was                            passed under direct vision. Throughout the                            procedure, the patient's blood pressure, pulse, and                            oxygen saturations were monitored  continuously. The                            [id number]) scope was introduced through the                            mouth, and advanced to the third part of duodenum.                            The upper GI endoscopy was accomplished without                            difficulty. The patient tolerated the procedure                            well. Scope In: 7:48:18 AM Scope Out: 7:57:53 AM Total Procedure Duration: 0 hours 9 minutes 35 seconds  Findings:      Initially, to bladder esophagus appeared widely patent with no mucosal       abnormalities.      A small hiatal hernia was present.      The exam was otherwise without abnormality.      The second portion of the duodenum was normal.      The scope was withdrawn. Dilation was performed with a Maloney dilator       with no resistance at 56 Fr. Dilation was performed with a Maloney       dilator with mild resistance at 74 Fr. The dilation site was examined       following endoscope reinsertion and showed moderate improvement in       luminal narrowing. there were (2) 3-4 cm very skinny tears midesophagus       proximal esophagus. These were superficial. Minimal bleedingMucosal       changes including ringed esophagus and feline appearance were found in       the middle third of the esophagus. . Biopsies were obtained from the       proximal and distal esophagus with cold forceps for histology of       suspected eosinophilic esophagitis. Estimated blood loss: none. Impression:               - Esophageal mucosal changes. Dilated. Biopsied.                           -  Small hiatal hernia.                           - The examination was otherwise normal.                           - Normal second portion of the duodenum. Moderate Sedation:      Moderate (conscious) sedation was personally administered by an       anesthesia professional. The following parameters were monitored: oxygen       saturation, heart rate, blood pressure,  respiratory rate, EKG, adequacy       of pulmonary ventilation, and response to care. Total physician       intraservice time was 34 minutes. Recommendation:           - Patient has a contact number available for                            emergencies. The signs and symptoms of potential                            delayed complications were discussed with the                            patient. Return to normal activities tomorrow.                            Written discharge instructions were provided to the                            patient.                           - Advance diet as tolerated.                           - Continue present medications.                           - Await pathology results.                           - Repeat upper endoscopy PRN for surveillance based                            on pathology results.                           - Return to GI office in 6 weeks. Procedure Code(s):        --- Professional ---                           [phone], Esophagogastroduodenoscopy, flexible,                            transoral; with biopsy, single or multiple  43450, Dilation of esophagus, by unguided sound or                            bougie, single or multiple passes Diagnosis Code(s):        --- Professional ---                           K20.9, Esophagitis, unspecified                           K44.9, Diaphragmatic hernia without obstruction or                            gangrene                           R13.10, Dysphagia, unspecified CPT copyright 2016 American Medical Association. All rights reserved. The codes documented in this report are preliminary and upon coder review may  be revised to meet current compliance requirements. [name]. [name], MD Norvel Richards, MD 10/07/2015 8:29:14 AM This report has been signed electronically. Number of Addenda: 0

## 2015-10-09 ENCOUNTER — Telehealth: Payer: Self-pay | Admitting: Internal Medicine

## 2015-10-09 ENCOUNTER — Encounter: Payer: Self-pay | Admitting: Internal Medicine

## 2015-10-09 NOTE — Telephone Encounter (Signed)
Routing to RMR- there is nothing in the procedure notes or pt instructions about this.

## 2015-10-09 NOTE — Telephone Encounter (Signed)
[place] A LETTER HER NEEDS TO HAVE FOR HIS DENTIST REGARDING NEEDING HIS THROAT STRETCHED.

## 2015-10-09 NOTE — Telephone Encounter (Signed)
I'm really not sure whether or not he needs to have his teeth "ground down" or not. I would defer to the dental expert on  how to best treat his teeth to optimize chewing

## 2015-10-09 NOTE — Telephone Encounter (Signed)
See other phone note

## 2015-10-09 NOTE — Telephone Encounter (Signed)
Pt called to say that he needs a letter from [employer] to give to his dentist about needing to have his lower teeth ground down to help him chew without choking. Pt said he had lower partials and they were misplaced at [facility] while doing an MRI and insurance will not cover to replace them. I told him that I would type up a note for the nurse but I also wanted him to leave a VM for the nurse as well explaining what he was needing. Call transferred to [facility]

## 2015-10-10 ENCOUNTER — Encounter (HOSPITAL_COMMUNITY): Payer: Self-pay | Admitting: Internal Medicine

## 2015-10-11 NOTE — Telephone Encounter (Signed)
Tried to call pt, NA. 

## 2015-10-15 NOTE — Telephone Encounter (Signed)
Pt called - he kept saying he needed a new bottom partial and the only way medicaid will pay for it is if the dentist has a letter from [place]. He gave me the number for the dentist [phone]). I called [place] and they explained to me that the patient had lost his bottom partial when he went for a CT at [facility] and in order for medicaid to pay for it again they need a letter saying that the patient needs his bottom partial in order to eat and that it was detrimental to his health and possibly list his health conditions and what can happen if he isnt able to get the proper intake of food or if he is getting choked because he is unable to chew his food properly.  Their fax number is [phone].   Dr.Rourk, is this something we can do?

## 2015-10-16 ENCOUNTER — Encounter: Payer: Self-pay | Admitting: Internal Medicine

## 2015-10-16 NOTE — Telephone Encounter (Signed)
See letter.

## 2015-10-16 NOTE — Telephone Encounter (Signed)
Letter faxed to the dentist.

## 2015-11-08 ENCOUNTER — Encounter: Payer: Self-pay | Admitting: Internal Medicine

## 2015-11-08 ENCOUNTER — Ambulatory Visit (INDEPENDENT_AMBULATORY_CARE_PROVIDER_SITE_OTHER): Payer: Medicare Other | Admitting: Internal Medicine

## 2015-11-08 VITALS — BP 137/86 | HR 88 | Temp 98.1°F | Ht 74.0 in | Wt 258.8 lb

## 2015-11-08 DIAGNOSIS — K648 Other hemorrhoids: Secondary | ICD-10-CM

## 2015-11-08 NOTE — Patient Instructions (Signed)
Avoid straining.  Benefiber 1 tablespoon twice daily  Limit toilet time to 5 minutes  Call with any interim problems  Schedule followup appointment in 4 weeks from now   

## 2015-11-08 NOTE — Progress Notes (Signed)
[place] banding procedure note:  The patient presents with symptomatic grade 2 hemorrhoids; requesting rubber band ligation of his hemorrhoidal disease. All risks, benefits, and alternative forms of therapy were described and informed consent was obtained.  In the left lateral decubitus position , a DRE revealed no abnormalities. Anoscopy revealed a prominent right anterior and posterior hemorrhoid column only.  The decision was made to band the right anterior internal hemorrhoid; the [place] was used to perform band ligation without complication. Digital anorectal examination was then performed to assure proper positioning of the band and to adjust the banded tissue as required. The patient was discharged home without pain or other issues. Dietary and behavioral recommendations were given along with follow-up instructions. The patient will return in 4 weeks for followup and possible additional banding as required.  No complications were encountered and the patient tolerated the procedure well.

## 2015-11-11 ENCOUNTER — Telehealth: Payer: Self-pay

## 2015-11-11 NOTE — Telephone Encounter (Signed)
Pt called- he had a hemorrhoid banding last Friday. He is still having a lot of loose stools. He said they are not watery, just loose. The fiber is not helping. He wants to know if he can take immodium.  He had 2 episodes over the weekend of BRBPR with wiping.  He also wanted to let [facility]MR know that his head sweats a lot at night while he is sleeping, (he has already gone through 3 pillows this year) and it sometimes happens when he eats. He didn't know if this could be related to his loose stools or not.

## 2015-11-11 NOTE — Telephone Encounter (Signed)
Tried to call pt- NA- LMOM 

## 2015-11-11 NOTE — Telephone Encounter (Signed)
I suspect sweating has nothing to do with bowel symptoms. If he is having diarrhea, would stop fiber supplement for now. Imodium is okay. Bleeding should settle down.

## 2015-11-12 NOTE — Telephone Encounter (Signed)
Pt is aware.  

## 2015-11-13 ENCOUNTER — Ambulatory Visit: Payer: Medicaid Other | Admitting: Nurse Practitioner

## 2015-12-10 ENCOUNTER — Encounter: Payer: Self-pay | Admitting: Internal Medicine

## 2015-12-10 ENCOUNTER — Ambulatory Visit (INDEPENDENT_AMBULATORY_CARE_PROVIDER_SITE_OTHER): Payer: Medicare Other | Admitting: Internal Medicine

## 2015-12-10 VITALS — BP 129/78 | HR 69 | Temp 97.4°F | Ht 74.0 in | Wt 256.4 lb

## 2015-12-10 DIAGNOSIS — K648 Other hemorrhoids: Secondary | ICD-10-CM

## 2015-12-10 NOTE — Progress Notes (Signed)
[place] banding procedure note:  The patient presents with symptomatic grade 2 hemorrhoids - bleeding has ceased since the right anterior hemorrhoid column was banded area Patient wishes a second band placed today. Has intermittent chronic diarrhea for which he takes Imodium. All risks, benefits, and alternative forms of therapy were described and informed consent was obtained.  In the left lateral decubitus position, a DRE revealed only a small grade 2 hemorrhoidal tag.  The decision was made to band the left lateral internal hemorrhoid;  the [place] was used to perform band ligation without complication. Digital anorectal examination was then performed to assure proper positioning of the band; initial band felt to be only very superficially placed. No pinching or pain. I elected to deploy a second band in the same location. A follow-up DRE revealed adequate tissue being engaged. Again, no pinching or pain.The patient was discharged home without pain or other issues. Dietary and behavioral recommendations were givenNo complications were encountered and the patient tolerated the procedure well.  C. The patient back in 4 weeks and reassess.

## 2015-12-10 NOTE — Patient Instructions (Signed)
Avoid straining.  May use Imodium as needed for diarrhea  Limit toilet time to 5 minutes  Call with any interim problems  Schedule followup appointment in 4 weeks from now

## 2015-12-31 ENCOUNTER — Emergency Department (HOSPITAL_COMMUNITY): Payer: Medicare Other

## 2015-12-31 ENCOUNTER — Encounter (HOSPITAL_COMMUNITY): Payer: Self-pay | Admitting: Emergency Medicine

## 2015-12-31 ENCOUNTER — Emergency Department (HOSPITAL_COMMUNITY)
Admission: EM | Admit: 2015-12-31 | Discharge: 2015-12-31 | Disposition: A | Discharge status: Home / Self Care | Payer: Medicare Other | Attending: Emergency Medicine | Admitting: Emergency Medicine

## 2015-12-31 DIAGNOSIS — Z79899 Other long term (current) drug therapy: Secondary | ICD-10-CM | POA: Diagnosis not present

## 2015-12-31 DIAGNOSIS — F329 Major depressive disorder, single episode, unspecified: Secondary | ICD-10-CM | POA: Insufficient documentation

## 2015-12-31 DIAGNOSIS — R61 Generalized hyperhidrosis: Secondary | ICD-10-CM | POA: Insufficient documentation

## 2015-12-31 DIAGNOSIS — R21 Rash and other nonspecific skin eruption: Secondary | ICD-10-CM

## 2015-12-31 DIAGNOSIS — I10 Essential (primary) hypertension: Secondary | ICD-10-CM | POA: Diagnosis not present

## 2015-12-31 LAB — URINALYSIS, ROUTINE W REFLEX MICROSCOPIC
BILIRUBIN URINE: NEGATIVE
Glucose, UA: NEGATIVE mg/dL
HGB URINE DIPSTICK: NEGATIVE
KETONES UR: NEGATIVE mg/dL
Leukocytes, UA: NEGATIVE
NITRITE: NEGATIVE
PH: 6 (ref 5.0–8.0)
Protein, ur: NEGATIVE mg/dL
SPECIFIC GRAVITY, URINE: 1.025 (ref 1.005–1.030)

## 2015-12-31 LAB — COMPREHENSIVE METABOLIC PANEL
ALK PHOS: 111 U/L (ref 38–126)
ALT: 158 U/L — AB (ref 17–63)
AST: 74 U/L — AB (ref 15–41)
Albumin: 4.3 g/dL (ref 3.5–5.0)
Anion gap: 6 (ref 5–15)
BILIRUBIN TOTAL: 0.4 mg/dL (ref 0.3–1.2)
BUN: 20 mg/dL (ref 6–20)
CALCIUM: 9.3 mg/dL (ref 8.9–10.3)
CHLORIDE: 101 mmol/L (ref 101–111)
CO2: 28 mmol/L (ref 22–32)
CREATININE: 0.84 mg/dL (ref 0.61–1.24)
Glucose, Bld: 113 mg/dL — ABNORMAL HIGH (ref 65–99)
Potassium: 4.2 mmol/L (ref 3.5–5.1)
Sodium: 135 mmol/L (ref 135–145)
Total Protein: 7.6 g/dL (ref 6.5–8.1)

## 2015-12-31 LAB — CBC WITH DIFFERENTIAL/PLATELET
BASOS ABS: 0 10*3/uL (ref 0.0–0.1)
BASOS PCT: 0 %
EOS ABS: 0.1 10*3/uL (ref 0.0–0.7)
Eosinophils Relative: 1 %
HCT: 45.9 % (ref 39.0–52.0)
Hemoglobin: 15.3 g/dL (ref 13.0–17.0)
LYMPHS PCT: 16 %
Lymphs Abs: 2 10*3/uL (ref 0.7–4.0)
MCH: 29.3 pg (ref 26.0–34.0)
MCHC: 33.3 g/dL (ref 30.0–36.0)
MCV: 87.9 fL (ref 78.0–100.0)
MONOS PCT: 8 %
Monocytes Absolute: 1 10*3/uL (ref 0.1–1.0)
NEUTROS PCT: 75 %
Neutro Abs: 9.1 10*3/uL — ABNORMAL HIGH (ref 1.7–7.7)
PLATELETS: 209 10*3/uL (ref 150–400)
RBC: 5.22 MIL/uL (ref 4.22–5.81)
RDW: 14.2 % (ref 11.5–15.5)
WBC: 12.2 10*3/uL — ABNORMAL HIGH (ref 4.0–10.5)

## 2015-12-31 MED ORDER — SULFAMETHOXAZOLE-TRIMETHOPRIM 800-160 MG PO TABS: 1.0000 | ORAL_TABLET | Freq: Two times a day (BID) | ORAL | Status: AC

## 2015-12-31 NOTE — ED Notes (Signed)
Pt reports painful, itchy rash between his shoulder blades x 1 month. States that he was sent over by neurologist for possible shingles. Pt also reports night sweats ove the past month.

## 2015-12-31 NOTE — ED Provider Notes (Signed)
CSN: [id number]     Arrival date & time 12/31/15  1146 History  By signing my name below, I, [facility], attest that this documentation has been prepared under the direction and in the presence of Milton Ferguson, MD. Electronically Signed: Virgel Bouquet, ED Scribe. 12/31/2015. 1:29 PM.   Chief Complaint  Patient presents with  . Rash    Patient is a 50 y.o. male presenting with rash. The history is provided by the patient. No language interpreter was used.  Rash Location:  Torso Torso rash location:  Upper back and lower back Quality: itchiness and painful   Severity:  Moderate Onset quality:  Gradual Duration:  1 month Timing:  Constant Progression:  Worsening Chronicity:  New Context: medications   Context: not exposure to similar rash   Relieved by:  None tried Ineffective treatments:  None tried Associated symptoms: no abdominal pain, no diarrhea, no fatigue and no headaches    HPI Comments: MARSHAWN CLINE is a 50 y.o. male with a hx of DDD, HTN, and chronic back pain who presents to the Emergency Department complaining of a constant, gradually worsening, mildly painful rash to his entire back onset 1 month ago. Pt states that he has been having cortisone injections for chronic pain in his back due to DDD but that he noticed a rash appear one month ago followed by worsening of his night diaphoresis. He notes that he was at his neurologist today when he was advised to present to the ED for further evaluation. Denies being evaluated for this rash in the past. Denies any other symptoms currently.  Past Medical History  Diagnosis Date  . Anxiety     and atypical chest pain  . Labile hypertension   . GERD (gastroesophageal reflux disease)   . Depression   . DDD (degenerative disc disease)   . Chronic back pain   . Hypercholesteremia   . Sleep apnea     does not wear CPAP  . Multilevel degenerative disc disease   . Hemorrhoids    Past Surgical History   Procedure Laterality Date  . Neck fusion    . Mandible fracture surgery      wired shut after a fall  . Knee arthroscopy Left   . Colonoscopy with propofol N/A 10/07/2015    Dr.Rourk- diverticulosis in the entire examined colon, internal hemorrhoids, the examination was o/w normal  . Esophagogastroduodenoscopy (egd) with propofol N/A 10/07/2015    Dr.Rourk- esophageal mucosa changes, dilated, small hiatal hernia, the examination was o/w normal bx= benign squamous mucosa with minimal reactive changes  . Maloney dilation N/A 10/07/2015    Procedure: Venia Minks DILATION;  Surgeon: Daneil Dolin, MD;  [facility];  Service: Endoscopy;  Laterality: N/A;  . Biopsy  10/07/2015    Procedure: BIOPSY;  Surgeon: Daneil Dolin, MD;  Location: [facility];  Service: Endoscopy;;  esophageal bx;   Marland Kitchen Hemorrhoid banding  2015/10/05    Dr.Rourk   Family History  Problem Relation Age of Onset  . [name] Mother   . Hypertension Mother   . Lung cancer Father   . Colon cancer Mother 36    Passed away 05-Oct-2010 from stage IV CRC   Social History  Substance Use Topics  . Smoking status: Never Smoker   . Smokeless tobacco: Never Used     Comment: Never smoked  . Alcohol Use: No    Review of Systems  Constitutional: Positive for diaphoresis. Negative for appetite change and  fatigue.  HENT: Negative for congestion, ear discharge and sinus pressure.   Eyes: Negative for discharge.  Respiratory: Negative for cough.   Cardiovascular: Negative for chest pain.  Gastrointestinal: Negative for abdominal pain and diarrhea.  Genitourinary: Negative for frequency and hematuria.  Musculoskeletal: Negative for back pain.  Skin: Positive for rash.  Neurological: Negative for seizures and headaches.  Psychiatric/Behavioral: Negative for hallucinations.      Allergies  Ibuprofen and Lyrica  Home Medications   Prior to Admission medications   Medication Sig Start Date End Date Taking? Authorizing Provider   acetaminophen-codeine (TYLENOL #3) 300-30 MG tablet Reported on 12/10/2015 08/29/15   Historical Provider, MD  aspirin 81 MG tablet Take 81 mg by mouth daily.    Historical Provider, MD  atorvastatin (LIPITOR) 20 MG tablet TAKE 1 TABLET BY MOUTH AT BEDTIME FOR CHOLESTEROL 08/22/15   Historical Provider, MD  cabergoline (DOSTINEX) 0.5 MG tablet  07/23/15   Historical Provider, MD  cyclobenzaprine (FLEXERIL) 10 MG tablet Take 1 tablet (10 mg total) by mouth 3 (three) times daily as needed. 10/01/15   Tammy Triplett, PA-C  diazepam (VALIUM) 5 MG tablet Take 5 mg by mouth 2 (two) times daily. Reported on 11/08/2015    Historical Provider, MD  Gabapentin & Lidocaine-Menthol (ACTIVE-PAC/GABAPENTIN) 300 & 4-1 MG & % THPK APPLY 1-2GM FOUR TIMES DAILY TO PAIN AREA (RUB IN WELL) 09/27/15   Historical Provider, MD  gabapentin (NEURONTIN) 300 MG capsule TAKE 1 CAPSULE BY MOUTH THREE TIMES DAILY FOR BACK AND NERVE PAIN 08/22/15   Historical Provider, MD  HYDROcodone-acetaminophen (NORCO/VICODIN) 5-325 MG tablet Reported on 12/10/2015 09/24/15   Historical Provider, MD  lidocaine (XYLOCAINE) 5 % ointment Apply 1 application topically 2 (two) times daily as needed.    Historical Provider, MD  lisinopril (PRINIVIL,ZESTRIL) 20 MG tablet Take 20 mg by mouth daily. 09/27/15   Historical Provider, MD  methylPREDNISolone (MEDROL DOSEPAK) 4 MG TBPK tablet Taper 24 mg to 0 over 7 days. Patient not taking: Reported on 11/08/2015 10/01/15   Tammy Triplett, PA-C  mirtazapine (REMERON) 15 MG tablet Take 15 mg by mouth at bedtime.    Historical Provider, MD  NON FORMULARY Reported on 11/08/2015    Historical Provider, MD  oxyCODONE-acetaminophen (PERCOCET) 10-325 MG tablet Reported on 12/10/2015 09/04/15   Historical Provider, MD  pantoprazole (PROTONIX) 40 MG tablet Take 40 mg by mouth daily.    Historical Provider, MD  penicillin v potassium (VEETID) 500 MG tablet Reported on 11/08/2015 09/16/15   Historical Provider, MD  polyethylene  glycol-electrolytes (NULYTELY/GOLYTELY) 420 g solution Take 4,000 mLs by mouth once. Patient not taking: Reported on 11/08/2015 09/16/15   Carlis Stable, NP  Probiotic Product (PROBIOTIC PO) Take by mouth.    Historical Provider, MD  sertraline (ZOLOFT) 100 MG tablet Take 100 mg by mouth daily. Takes one and a half tablets daily    Historical Provider, MD  simvastatin (ZOCOR) 20 MG tablet Take 20 mg by mouth at bedtime.    Historical Provider, MD  Vitamin D, Ergocalciferol, (DRISDOL) 50000 units CAPS capsule TAKE 1 CAPSULE BY MOUTH TWICE A WEEK 08/22/15   Historical Provider, MD   BP 127/86 mmHg  Pulse 79  Temp(Src) 98 F (36.7 C) (Oral)  Resp 18  Ht 6\' 2"  (1.88 m)  Wt 264 lb (119.75 kg)  BMI 33.88 kg/m2  SpO2 97% Physical Exam  Constitutional: He is oriented to person, place, and time. He appears well-developed.  HENT:  Head: Normocephalic.  Eyes:  Conjunctivae and EOM are normal. No scleral icterus.  Neck: Neck supple. No thyromegaly present.  Cardiovascular: Normal rate and regular rhythm.  Exam reveals no gallop and no friction rub.   No murmur heard. Pulmonary/Chest: No stridor. He has no wheezes. He has no rales. He exhibits no tenderness.  Abdominal: He exhibits no distension. There is no tenderness. There is no rebound.  Musculoskeletal: Normal range of motion. He exhibits no edema.  Lymphadenopathy:    He has no cervical adenopathy.  Neurological: He is oriented to person, place, and time. He exhibits normal muscle tone. Coordination normal.  Skin: Rash noted. Rash is maculopapular. No erythema.  Maculopapular rash to right upper back.  Psychiatric: He has a normal mood and affect. His behavior is normal.  Vitals reviewed.   ED Course  Procedures (including critical care time)  DIAGNOSTIC STUDIES: Oxygen Saturation is 97% on RA, normal by my interpretation.    COORDINATION OF CARE: 12:24 PM Will order labs and chest x-ray. Discussed treatment plan with pt at bedside and  pt agreed to plan.   Labs Review Labs Reviewed  CBC WITH DIFFERENTIAL/PLATELET  COMPREHENSIVE METABOLIC PANEL  URINALYSIS, ROUTINE W REFLEX MICROSCOPIC (NOT AT [facility])    Imaging Review Dg Chest 2 View  12/31/2015  CLINICAL DATA:  Night sweats for 2 months, rash on back for 1 month, labile hypertension, GERD, hypercholesterolemia EXAM: CHEST  2 VIEW COMPARISON:  None. FINDINGS: Minimal enlargement of cardiac silhouette. Mediastinal contours and pulmonary vascularity normal. Lungs clear. No pleural effusion or pneumothorax. Prior cervical fusion. No acute osseous findings. IMPRESSION: No acute abnormalities. Minimal enlargement of cardiac silhouette. Electronically Signed   By: Lavonia Dana M.D.   On: 12/31/2015 12:58   I have personally reviewed and evaluated these images and lab results as part of my medical decision-making.   EKG Interpretation None      MDM   Final diagnoses:  None    Patient with rash to his upper back. That's been there over a month. We'll cover patient with Bactrim for possible skin infection. He also has elevated liver studies that will be followed up by his family doctor next week.  The chart was scribed for me under my direct supervision.  I personally performed the history, physical, and medical decision making and all procedures in the evaluation of this patient.Milton Ferguson, MD 12/31/15 1455

## 2015-12-31 NOTE — Discharge Instructions (Signed)
Follow up with your family md next week.  He will need to recheck your liver studies and recheck your rash

## 2016-01-21 ENCOUNTER — Emergency Department (HOSPITAL_COMMUNITY): Payer: Medicare Other

## 2016-01-21 ENCOUNTER — Emergency Department (HOSPITAL_COMMUNITY)
Admission: EM | Admit: 2016-01-21 | Discharge: 2016-01-21 | Discharge status: Against Medical Advice | Payer: Medicare Other | Attending: Emergency Medicine | Admitting: Emergency Medicine

## 2016-01-21 ENCOUNTER — Encounter (HOSPITAL_COMMUNITY): Payer: Self-pay | Admitting: Emergency Medicine

## 2016-01-21 DIAGNOSIS — Z79899 Other long term (current) drug therapy: Secondary | ICD-10-CM | POA: Diagnosis not present

## 2016-01-21 DIAGNOSIS — R519 Headache, unspecified: Secondary | ICD-10-CM

## 2016-01-21 DIAGNOSIS — R51 Headache: Secondary | ICD-10-CM | POA: Diagnosis not present

## 2016-01-21 DIAGNOSIS — Z7982 Long term (current) use of aspirin: Secondary | ICD-10-CM | POA: Insufficient documentation

## 2016-01-21 DIAGNOSIS — I10 Essential (primary) hypertension: Secondary | ICD-10-CM | POA: Insufficient documentation

## 2016-01-21 DIAGNOSIS — R072 Precordial pain: Secondary | ICD-10-CM | POA: Diagnosis not present

## 2016-01-21 DIAGNOSIS — I959 Hypotension, unspecified: Secondary | ICD-10-CM | POA: Diagnosis not present

## 2016-01-21 DIAGNOSIS — R079 Chest pain, unspecified: Secondary | ICD-10-CM

## 2016-01-21 LAB — CBC
HEMATOCRIT: 50.4 % (ref 39.0–52.0)
HEMOGLOBIN: 16.8 g/dL (ref 13.0–17.0)
MCH: 29.3 pg (ref 26.0–34.0)
MCHC: 33.3 g/dL (ref 30.0–36.0)
MCV: 88 fL (ref 78.0–100.0)
Platelets: 279 10*3/uL (ref 150–400)
RBC: 5.73 MIL/uL (ref 4.22–5.81)
RDW: 13.8 % (ref 11.5–15.5)
WBC: 16.7 10*3/uL — ABNORMAL HIGH (ref 4.0–10.5)

## 2016-01-21 LAB — BASIC METABOLIC PANEL
ANION GAP: 9 (ref 5–15)
BUN: 18 mg/dL (ref 6–20)
CHLORIDE: 104 mmol/L (ref 101–111)
CO2: 26 mmol/L (ref 22–32)
Calcium: 10 mg/dL (ref 8.9–10.3)
Creatinine, Ser: 1.45 mg/dL — ABNORMAL HIGH (ref 0.61–1.24)
GFR calc non Af Amer: 54 mL/min — ABNORMAL LOW (ref >60)
GLUCOSE: 124 mg/dL — AB (ref 65–99)
POTASSIUM: 3.8 mmol/L (ref 3.5–5.1)
Sodium: 139 mmol/L (ref 135–145)

## 2016-01-21 LAB — CBG MONITORING, ED: Glucose-Capillary: 130 mg/dL — ABNORMAL HIGH (ref 65–99)

## 2016-01-21 LAB — TROPONIN I: Troponin I: <0.03 ng/mL (ref <0.03)

## 2016-01-21 LAB — I-STAT TROPONIN, ED: Troponin i, poc: 0 ng/mL (ref 0.00–0.08)

## 2016-01-21 MED ORDER — DEXAMETHASONE SODIUM PHOSPHATE 10 MG/ML IJ SOLN
10.0000 mg | Freq: Once | INTRAMUSCULAR | Status: AC
  Administered 2016-01-21: 10 mg via INTRAVENOUS
  Filled 2016-01-21: qty 1

## 2016-01-21 MED ORDER — MORPHINE SULFATE (PF) 4 MG/ML IV SOLN
4.0000 mg | Freq: Once | INTRAVENOUS | Status: AC
  Administered 2016-01-21: 4 mg via INTRAVENOUS
  Filled 2016-01-21: qty 1

## 2016-01-21 MED ORDER — PROCHLORPERAZINE EDISYLATE 5 MG/ML IJ SOLN
10.0000 mg | Freq: Once | INTRAMUSCULAR | Status: AC
  Administered 2016-01-21: 10 mg via INTRAVENOUS
  Filled 2016-01-21: qty 2

## 2016-01-21 MED ORDER — ONDANSETRON HCL 4 MG/2ML IJ SOLN
4.0000 mg | Freq: Once | INTRAMUSCULAR | Status: AC
  Administered 2016-01-21: 4 mg via INTRAVENOUS
  Filled 2016-01-21: qty 2

## 2016-01-21 MED ORDER — ASPIRIN 81 MG PO CHEW
324.0000 mg | CHEWABLE_TABLET | Freq: Once | ORAL | Status: AC
  Administered 2016-01-21: 324 mg via ORAL
  Filled 2016-01-21: qty 4

## 2016-01-21 MED ORDER — DIPHENHYDRAMINE HCL 50 MG/ML IJ SOLN
12.5000 mg | Freq: Once | INTRAMUSCULAR | Status: AC
  Administered 2016-01-21: 12.5 mg via INTRAVENOUS
  Filled 2016-01-21: qty 1

## 2016-01-21 MED ORDER — SODIUM CHLORIDE 0.9 % IV SOLN
Freq: Once | INTRAVENOUS | Status: AC
  Administered 2016-01-21: 15:00:00 via INTRAVENOUS

## 2016-01-21 NOTE — ED Notes (Cosign Needed Addendum)
Pt reports that chest pain has resolved, but head hurting when he arrived and has worsened. EDP notified. New order to administer 12.5 mg Benadryl IV and Compazine 10 mg IV for HA

## 2016-01-21 NOTE — ED Notes (Signed)
Pt given po liquids and crackers/PB

## 2016-01-21 NOTE — ED Triage Notes (Signed)
Pt reports cp in center of chest radiating to neck and right shoulder x45 min.  Pt took 81mg  asa this morning.  Pt reports ha, denies n/v/d/sob.  Pt diaphoretic.

## 2016-01-21 NOTE — ED Notes (Signed)
Notified Evalee Jefferson, PA of bp and liter bolus ordered.

## 2016-01-21 NOTE — ED Provider Notes (Signed)
  Face-to-face evaluation   History: He reports onset of chest pain today, shortly after leaving court. He has subsequently developed a headache. He has had similar headache and chest pain the past. He has chronic neck and back pain.  Physical exam: Alert, calm, cooperative. Somewhat sleepy after medications given here in the emergency department. Normal behavior and affect. No respiratory distress.  Medical screening examination/treatment/procedure(s) were conducted as a shared visit with non-physician practitioner(s) and myself.  I personally evaluated the patient during the encounter   Daleen Bo, MD 01/22/16 [phone]

## 2016-01-21 NOTE — Discharge Instructions (Addendum)
Call your doctor for a recheck appointment within the week.  You labs, ekg and chest xray are ok today.  You do have some evidence of being dehydrated, however - make sure you are drinking plenty of fluids.  I also suggest checking your blood pressure once or twice daily since your blood pressure is low today and hold your lisinopril until rechecked by your doctor this week.
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
## 2016-01-28 ENCOUNTER — Encounter: Payer: Self-pay | Admitting: Internal Medicine

## 2016-01-28 ENCOUNTER — Encounter: Payer: Medicare Other | Admitting: Internal Medicine

## 2016-01-28 ENCOUNTER — Telehealth: Payer: Self-pay | Admitting: Internal Medicine

## 2016-01-28 NOTE — Telephone Encounter (Signed)
PT WAS A NO SHOW AND LETTER SENT  °

## 2016-02-04 ENCOUNTER — Encounter: Payer: Medicare Other | Admitting: Internal Medicine

## 2017-02-17 ENCOUNTER — Encounter (HOSPITAL_COMMUNITY): Payer: Self-pay

## 2017-02-17 ENCOUNTER — Emergency Department (HOSPITAL_COMMUNITY)
Admission: EM | Admit: 2017-02-17 | Discharge: 2017-02-17 | Disposition: A | Discharge status: Home / Self Care | Payer: Medicare HMO | Attending: Emergency Medicine | Admitting: Emergency Medicine

## 2017-02-17 DIAGNOSIS — F341 Dysthymic disorder: Secondary | ICD-10-CM | POA: Insufficient documentation

## 2017-02-17 DIAGNOSIS — M542 Cervicalgia: Secondary | ICD-10-CM | POA: Insufficient documentation

## 2017-02-17 DIAGNOSIS — R1032 Left lower quadrant pain: Secondary | ICD-10-CM | POA: Diagnosis present

## 2017-02-17 DIAGNOSIS — Z7982 Long term (current) use of aspirin: Secondary | ICD-10-CM | POA: Diagnosis not present

## 2017-02-17 DIAGNOSIS — M545 Low back pain: Secondary | ICD-10-CM | POA: Insufficient documentation

## 2017-02-17 DIAGNOSIS — Z79899 Other long term (current) drug therapy: Secondary | ICD-10-CM | POA: Insufficient documentation

## 2017-02-17 NOTE — ED Provider Notes (Signed)
[facility] DEPT Provider Note   CSN: [id number] Arrival date & time: 02/17/17  1022     History   Chief Complaint Chief Complaint  Patient presents with  . Groin Pain  . Shortness of Breath    HPI Craig Drake is a 51 y.o. male.  He is here for evaluation of left groin swelling and pain.  The swelling is intermittent.  He thinks it was a hernia, and is interested in "getting it fixed".  He tried to see his PCP today, but their computers were down so he had to come here.  He also complains of ongoing neck and back pain and previously has had surgery.  Sometimes he gets depressed, but nothing out of the ordinary.  He denies homicidal or suicidal ideation.  He walks with a cane because of chronic pain.  He takes morphine daily for pain.  He denies recent fever, chills, vomiting or dizziness.  There are no other no modifying factors.  HPI  Past Medical History:  Diagnosis Date  . Anxiety    and atypical chest pain  . Chronic back pain   . DDD (degenerative disc disease)   . Depression   . GERD (gastroesophageal reflux disease)   . Hemorrhoids   . Hypercholesteremia   . Labile hypertension   . Multilevel degenerative disc disease   . Sleep apnea    does not wear CPAP    Patient Active Problem List   Diagnosis Date Noted  . Mucosal abnormality of esophagus   . GERD (gastroesophageal reflux disease) 09/12/2015  . Dysphagia 09/12/2015  . Hyperlipidemia 01/11/2012  . RECTAL BLEEDING 07/03/2010  . ABDOMINAL PAIN-EPIGASTRIC 07/03/2010  . CHRONIC PAIN SYNDROME 12/17/2009  . ESSENTIAL HYPERTENSION, BENIGN 12/17/2009  . CHEST PAIN 12/17/2009    Past Surgical History:  Procedure Laterality Date  . BIOPSY  10/07/2015   Procedure: BIOPSY;  Surgeon: Daneil Dolin, MD;  Location: [facility];  Service: Endoscopy;;  esophageal bx;   Marland Kitchen COLONOSCOPY WITH PROPOFOL N/A 10/07/2015   Dr.Rourk- diverticulosis in the entire examined colon, internal hemorrhoids, the examination was  o/w normal  . ESOPHAGOGASTRODUODENOSCOPY (EGD) WITH PROPOFOL N/A 10/07/2015   Dr.Rourk- esophageal mucosa changes, dilated, small hiatal hernia, the examination was o/w normal bx= benign squamous mucosa with minimal reactive changes  . HEMORRHOID BANDING  2017   Dr.Rourk  . KNEE ARTHROSCOPY Left   . MALONEY DILATION N/A 10/07/2015   Procedure: Venia Minks DILATION;  Surgeon: Daneil Dolin, MD;  Location: [facility];  Service: Endoscopy;  Laterality: N/A;  . MANDIBLE FRACTURE SURGERY     wired shut after a fall  . neck fusion         Home Medications    Prior to Admission medications   Medication Sig Start Date End Date Taking? Authorizing Provider  aspirin 81 MG tablet Take 81 mg by mouth daily.    [provider]  atorvastatin (LIPITOR) 40 MG tablet Take 1 tablet by mouth at bedtime. 02/09/17   [provider]  baclofen (LIORESAL) 10 MG tablet Take 10 mg by mouth 2 (two) times daily as needed for muscle spasms.  12/19/15   [provider]  cyclobenzaprine (FLEXERIL) 10 MG tablet Take 1 tablet (10 mg total) by mouth 3 (three) times daily as needed. Patient not taking: Reported on 01/21/2016 10/01/15   Triplett, Tammy, PA-C  diazepam (VALIUM) 5 MG tablet Take 5 mg by mouth 2 (two) times daily. Reported on 11/08/2015  [provider]  EMBEDA 30-1.2 MG CPCR Take 1 capsule by mouth daily. 12/30/15   [provider]  Gabapentin & Lidocaine-Menthol (ACTIVE-PAC/GABAPENTIN) 300 & 4-1 MG & % THPK APPLY 1-2GM FOUR TIMES DAILY TO PAIN AREA (RUB IN WELL) 09/27/15   [provider]  gabapentin (NEURONTIN) 300 MG capsule TAKE 1 CAPSULE BY MOUTH THREE TIMES DAILY FOR BACK AND NERVE PAIN 08/22/15   [provider]  lisinopril (PRINIVIL,ZESTRIL) 20 MG tablet Take 20 mg by mouth daily. 09/27/15   [provider]  mirtazapine (REMERON) 15 MG tablet Take 15 mg by mouth at bedtime.    [provider]  morphine (MS CONTIN) 30 MG 12 hr  tablet Take 1 tablet by mouth every 12 (twelve) hours. 02/04/17   [provider]  pantoprazole (PROTONIX) 40 MG tablet Take 1 tablet by mouth daily. 01/16/16   [provider]  sertraline (ZOLOFT) 100 MG tablet Take 150 mg by mouth daily.     [provider]  Vitamin D, Ergocalciferol, (DRISDOL) 50000 units CAPS capsule TAKE 1 CAPSULE BY MOUTH TWICE A WEEK 08/22/15   [provider]    Family History Family History  Problem Relation Age of Onset  . [name] Mother   . Hypertension Mother   . Colon cancer Mother 25       Passed away 10/17/2010 from stage IV CRC  . Lung cancer Father     Social History Social History  Substance Use Topics  . Smoking status: Never Smoker  . Smokeless tobacco: Never Used     Comment: Never smoked  . Alcohol use No     Allergies   Ibuprofen and Lyrica [pregabalin]   Review of Systems Review of Systems  All other systems reviewed and are negative.    Physical Exam Updated Vital Signs BP (!) 148/118 (BP Location: Left Arm)   Pulse 76   Temp 97.9 F (36.6 C) (Oral)   Resp 16   Ht 6\' 1"  (1.854 m)   Wt 120.2 kg (265 lb)   SpO2 96%   BMI 34.96 kg/m   Physical Exam  Constitutional: He is oriented to person, place, and time. He appears well-developed. No distress.  Overweight  HENT:  Head: Normocephalic and atraumatic.  Right Ear: External ear normal.  Left Ear: External ear normal.  Eyes: Pupils are equal, round, and reactive to light. Conjunctivae and EOM are normal.  Neck: Normal range of motion and phonation normal. Neck supple.  Cardiovascular: Normal rate, regular rhythm and normal heart sounds.   Pulmonary/Chest: Effort normal and breath sounds normal. He exhibits no bony tenderness.  Abdominal: Soft. There is no tenderness.  Mildly tender left groin without palpable mass, or suspected hernia.  Genitourinary:  Genitourinary Comments: Circumcised penis, normal penis to exam, normal scrotum and scrotal  contents.  Musculoskeletal: Normal range of motion.  Neurological: He is alert and oriented to person, place, and time. No cranial nerve deficit or sensory deficit. He exhibits normal muscle tone. Coordination normal.  Skin: Skin is warm, dry and intact.  Psychiatric: He has a normal mood and affect. His behavior is normal. Judgment and thought content normal.  Nursing note and vitals reviewed.    ED Treatments / Results  Labs (all labs ordered are listed, but only abnormal results are displayed) Labs Reviewed - No data to display  EKG  EKG Interpretation None         Date:   Rate: 78  Rhythm: normal sinus rhythm  QRS Axis: normal  PR and QT Intervals: normal  ST/T Wave abnormalities: normal  PR and QRS Conduction Disutrbances:none  Narrative Interpretation:   Old EKG Reviewed: none available   Radiology No results found.  Procedures Procedures (including critical care time)  Medications Ordered in ED Medications - No data to display   Initial Impression / Assessment and Plan / ED Course  I have reviewed the triage vital signs and the nursing notes.  Pertinent labs & imaging results that were available during my care of the patient were reviewed by me and considered in my medical decision making (see chart for details).      No data found.   11:25 AM Reevaluation with update and discussion. After initial assessment and treatment, an updated evaluation reveals no further complaints.  Findings discussed with patient and all questions answered. Antaeus Karel L     Final Clinical Impressions(s) / ED Diagnoses   Final diagnoses:  Left groin pain   Nonspecific groin pain with possible hernia.  No indication for incarceration of hernia tissue, bowel blockage, metabolic instability.  Nursing Notes Reviewed/ Care Coordinated Applicable Imaging Reviewed Interpretation of Laboratory Data incorporated into ED treatment  The patient appears reasonably screened  and/or stabilized for discharge and I doubt any other medical condition or other [facility] requiring further screening, evaluation, or treatment in the ED at this time prior to discharge.  Plan: Home Medications-continue usual medicines; Home Treatments-rest; return here if the recommended treatment, does not improve the symptoms; Recommended follow up-general surgery follow-up to consider need for hernia repair   New Prescriptions Discharge Medication List as of 02/17/2017 11:23 AM       Daleen Bo, MD 02/18/17 [phone]

## 2017-02-17 NOTE — Discharge Instructions (Signed)
Call the surgeon for a follow-up appointment regarding the left groin pain.  If you feel like a hernia is out, and painful, lie down to help it get better.  Continue your regular medications.  Consider seeing your primary care doctor sooner than your scheduled appointment if needed, for problems.

## 2017-02-17 NOTE — ED Triage Notes (Signed)
Pt c/o pain in left groin, reports has a hernia.  Pt also reports feeling like when he breathes, the air can't go all the way in to his lungs."  Reports feeling like this off and on for the past year.  Pt says he went to his pcp today to try to be seen but doesn't have an appt until next month so was told to come to er.  Pt states, "I don't feel like i'm going to be around much longer."

## 2017-03-17 DIAGNOSIS — F1190 Opioid use, unspecified, uncomplicated: Secondary | ICD-10-CM | POA: Insufficient documentation

## 2017-03-17 DIAGNOSIS — M51369 Other intervertebral disc degeneration, lumbar region without mention of lumbar back pain or lower extremity pain: Secondary | ICD-10-CM | POA: Insufficient documentation

## 2017-03-30 ENCOUNTER — Ambulatory Visit: Payer: Medicare HMO | Admitting: General Surgery

## 2017-04-13 ENCOUNTER — Ambulatory Visit: Payer: Medicare HMO | Admitting: General Surgery

## 2017-04-13 ENCOUNTER — Encounter: Payer: Self-pay | Admitting: General Surgery

## 2017-04-13 ENCOUNTER — Ambulatory Visit (INDEPENDENT_AMBULATORY_CARE_PROVIDER_SITE_OTHER): Payer: Medicare HMO | Admitting: General Surgery

## 2017-04-13 VITALS — BP 162/101 | HR 72 | Temp 98.7°F | Resp 18 | Ht 74.0 in | Wt 272.0 lb

## 2017-04-13 DIAGNOSIS — K4090 Unilateral inguinal hernia, without obstruction or gangrene, not specified as recurrent: Secondary | ICD-10-CM | POA: Diagnosis not present

## 2017-04-13 NOTE — H&P (Addendum)
Craig Drake; [id number]; Dec 22, 1965   HPI Patient is a 51 year old white male who is referred to my care by Dr. Scotty Court for evaluation and treatment of a left inguinal hernia. He has had the left inguinal hernia for some time now, but is increasing in size and is causing him discomfort. He currently has a pain of 7 out of 10. He states it is made worse with straining. He does have chronic back pain and uses a cane to walk. Past Medical History:  Diagnosis Date  . Anxiety    and atypical chest pain  . Chronic back pain   . DDD (degenerative disc disease)   . Depression   . GERD (gastroesophageal reflux disease)   . Hemorrhoids   . Hypercholesteremia   . Labile hypertension   . Multilevel degenerative disc disease   . Sleep apnea    does not wear CPAP    Past Surgical History:  Procedure Laterality Date  . BIOPSY  10/07/2015   Procedure: BIOPSY;  Surgeon: Daneil Dolin, MD;  Location: [facility];  Service: Endoscopy;;  esophageal bx;   Marland Kitchen COLONOSCOPY WITH PROPOFOL N/A 10/07/2015   Dr.Rourk- diverticulosis in the entire examined colon, internal hemorrhoids, the examination was o/w normal  . ESOPHAGOGASTRODUODENOSCOPY (EGD) WITH PROPOFOL N/A 10/07/2015   Dr.Rourk- esophageal mucosa changes, dilated, small hiatal hernia, the examination was o/w normal bx= benign squamous mucosa with minimal reactive changes  . HEMORRHOID BANDING  Oct 15, 2015   Dr.Rourk  . KNEE ARTHROSCOPY Left   . MALONEY DILATION N/A 10/07/2015   Procedure: Venia Minks DILATION;  Surgeon: Daneil Dolin, MD;  Location: [facility];  Service: Endoscopy;  Laterality: N/A;  . MANDIBLE FRACTURE SURGERY     wired shut after a fall  . neck fusion      Family History  Problem Relation Age of Onset  . [name] Mother   . Hypertension Mother   . Colon cancer Mother 74       Passed away 2010/10/15 from stage IV CRC  . Lung cancer Father     Current Outpatient Prescriptions on File Prior to Visit  Medication Sig Dispense Refill  .  aspirin 81 MG tablet Take 81 mg by mouth daily.    Marland Kitchen atorvastatin (LIPITOR) 40 MG tablet Take 1 tablet by mouth at bedtime.  11  . baclofen (LIORESAL) 10 MG tablet Take 10 mg by mouth 2 (two) times daily as needed for muscle spasms.   2  . cyclobenzaprine (FLEXERIL) 10 MG tablet Take 1 tablet (10 mg total) by mouth 3 (three) times daily as needed. (Patient not taking: Reported on 01/21/2016) 21 tablet 0  . diazepam (VALIUM) 5 MG tablet Take 5 mg by mouth 2 (two) times daily. Reported on 11/08/2015    . EMBEDA 30-1.2 MG CPCR Take 1 capsule by mouth daily.    . Gabapentin & Lidocaine-Menthol (ACTIVE-PAC/GABAPENTIN) 300 & 4-1 MG & % THPK APPLY 1-2GM FOUR TIMES DAILY TO PAIN AREA (RUB IN WELL)  11  . gabapentin (NEURONTIN) 300 MG capsule TAKE 1 CAPSULE BY MOUTH THREE TIMES DAILY FOR BACK AND NERVE PAIN  3  . lisinopril (PRINIVIL,ZESTRIL) 20 MG tablet Take 20 mg by mouth daily.  1  . mirtazapine (REMERON) 15 MG tablet Take 15 mg by mouth at bedtime.    Marland Kitchen morphine (MS CONTIN) 30 MG 12 hr tablet Take 1 tablet by mouth every 12 (twelve) hours.  0  . pantoprazole (PROTONIX) 40 MG tablet Take 1 tablet  by mouth daily.    . sertraline (ZOLOFT) 100 MG tablet Take 150 mg by mouth daily.     . Vitamin D, Ergocalciferol, (DRISDOL) 50000 units CAPS capsule TAKE 1 CAPSULE BY MOUTH TWICE A WEEK  3   No current facility-administered medications on file prior to visit.     Allergies  Allergen Reactions  . Ibuprofen   . Lyrica [Pregabalin]     swelling    History  Alcohol Use No    History  Smoking Status  . Never Smoker  Smokeless Tobacco  . Never Used    Comment: Never smoked    Review of Systems  Constitutional: Positive for malaise/fatigue.  HENT: Negative.   Eyes: Positive for pain.  Respiratory: Negative.   Cardiovascular: Negative.   Gastrointestinal: Positive for abdominal pain.  Genitourinary: Negative.   Musculoskeletal: Positive for back pain, joint pain and neck pain.  Skin:  Negative.   Neurological: Positive for headaches.  Endo/Heme/Allergies: Negative.   Psychiatric/Behavioral: Negative.     Objective   Vitals:   04/13/17 0933  BP: (!) 162/101  Pulse: 72  Resp: 18  Temp: 98.7 F (37.1 C)    Physical Exam  Constitutional: He is oriented to person, place, and time and well-developed, well-nourished, and in no distress.  HENT:  Head: Normocephalic and atraumatic.  Cardiovascular: Normal rate, regular rhythm and normal heart sounds.  Exam reveals no gallop and no friction rub.   No murmur heard. Pulmonary/Chest: Effort normal and breath sounds normal. No respiratory distress. He has no wheezes. He has no rales.  Abdominal: Soft. Bowel sounds are normal. He exhibits no distension. There is no tenderness. There is no rebound.  Easily reducible left inguinal hernia. Weak right inguinal floor.  Genitourinary: Penis normal.  Genitourinary Comments: Testicles within normal limits.  Neurological: He is alert and oriented to person, place, and time.  Uses a cane to assist in ambulation.  Skin: Skin is warm and dry.  Vitals reviewed.  Dr. Rayna Sexton notes reviewed. Assessment  Left inguinal hernia Plan   Patient scheduled for a left inguinal herniorrhaphy with mesh on 04/21/2017. The risks and benefits of the procedure including bleeding, infection, mesh use, and the possibility of recurrence of the hernia were fully explained to the patient, who gave informed consent.

## 2017-04-13 NOTE — Patient Instructions (Signed)

## 2017-04-13 NOTE — Progress Notes (Signed)
Craig Drake; [id number]; [birth year]   HPI Patient is a 51 year old white male who is referred to my care by Dr. Scotty Court for evaluation and treatment of a left inguinal hernia. He has had the left inguinal hernia for some time now, but is increasing in size and is causing him discomfort. He currently has a pain of 7 out of 10. He states it is made worse with straining. He does have chronic back pain and uses a cane to walk. Past Medical History:  Diagnosis Date  . Anxiety    and atypical chest pain  . Chronic back pain   . DDD (degenerative disc disease)   . Depression   . GERD (gastroesophageal reflux disease)   . Hemorrhoids   . Hypercholesteremia   . Labile hypertension   . Multilevel degenerative disc disease   . Sleep apnea    does not wear CPAP    Past Surgical History:  Procedure Laterality Date  . BIOPSY  10/07/2015   Procedure: BIOPSY;  Surgeon: Daneil Dolin, MD;  Location: [facility];  Service: Endoscopy;;  esophageal bx;   Marland Kitchen COLONOSCOPY WITH PROPOFOL N/A 10/07/2015   Dr.Rourk- diverticulosis in the entire examined colon, internal hemorrhoids, the examination was o/w normal  . ESOPHAGOGASTRODUODENOSCOPY (EGD) WITH PROPOFOL N/A 10/07/2015   Dr.Rourk- esophageal mucosa changes, dilated, small hiatal hernia, the examination was o/w normal bx= benign squamous mucosa with minimal reactive changes  . HEMORRHOID BANDING  Oct 14, 2015   Dr.Rourk  . KNEE ARTHROSCOPY Left   . MALONEY DILATION N/A 10/07/2015   Procedure: Venia Minks DILATION;  Surgeon: Daneil Dolin, MD;  Location: [facility];  Service: Endoscopy;  Laterality: N/A;  . MANDIBLE FRACTURE SURGERY     wired shut after a fall  . neck fusion      Family History  Problem Relation Age of Onset  . [name] Mother   . Hypertension Mother   . Colon cancer Mother 32       Passed away 2010/10/14 from stage IV CRC  . Lung cancer Father     Current Outpatient Prescriptions on File Prior to Visit  Medication Sig Dispense Refill  .  aspirin 81 MG tablet Take 81 mg by mouth daily.    Marland Kitchen atorvastatin (LIPITOR) 40 MG tablet Take 1 tablet by mouth at bedtime.  11  . baclofen (LIORESAL) 10 MG tablet Take 10 mg by mouth 2 (two) times daily as needed for muscle spasms.   2  . cyclobenzaprine (FLEXERIL) 10 MG tablet Take 1 tablet (10 mg total) by mouth 3 (three) times daily as needed. (Patient not taking: Reported on 01/21/2016) 21 tablet 0  . diazepam (VALIUM) 5 MG tablet Take 5 mg by mouth 2 (two) times daily. Reported on 11/08/2015    . EMBEDA 30-1.2 MG CPCR Take 1 capsule by mouth daily.    . Gabapentin & Lidocaine-Menthol (ACTIVE-PAC/GABAPENTIN) 300 & 4-1 MG & % THPK APPLY 1-2GM FOUR TIMES DAILY TO PAIN AREA (RUB IN WELL)  11  . gabapentin (NEURONTIN) 300 MG capsule TAKE 1 CAPSULE BY MOUTH THREE TIMES DAILY FOR BACK AND NERVE PAIN  3  . lisinopril (PRINIVIL,ZESTRIL) 20 MG tablet Take 20 mg by mouth daily.  1  . mirtazapine (REMERON) 15 MG tablet Take 15 mg by mouth at bedtime.    Marland Kitchen morphine (MS CONTIN) 30 MG 12 hr tablet Take 1 tablet by mouth every 12 (twelve) hours.  0  . pantoprazole (PROTONIX) 40 MG tablet Take 1 tablet  by mouth daily.    . sertraline (ZOLOFT) 100 MG tablet Take 150 mg by mouth daily.     . Vitamin D, Ergocalciferol, (DRISDOL) 50000 units CAPS capsule TAKE 1 CAPSULE BY MOUTH TWICE A WEEK  3   No current facility-administered medications on file prior to visit.     Allergies  Allergen Reactions  . Ibuprofen   . Lyrica [Pregabalin]     swelling    History  Alcohol Use No    History  Smoking Status  . Never Smoker  Smokeless Tobacco  . Never Used    Comment: Never smoked    Review of Systems  Constitutional: Positive for malaise/fatigue.  HENT: Negative.   Eyes: Positive for pain.  Respiratory: Negative.   Cardiovascular: Negative.   Gastrointestinal: Positive for abdominal pain.  Genitourinary: Negative.   Musculoskeletal: Positive for back pain, joint pain and neck pain.  Skin:  Negative.   Neurological: Positive for headaches.  Endo/Heme/Allergies: Negative.   Psychiatric/Behavioral: Negative.     Objective   Vitals:   04/13/17 0933  BP: (!) 162/101  Pulse: 72  Resp: 18  Temp: 98.7 F (37.1 C)    Physical Exam  Constitutional: He is oriented to person, place, and time and well-developed, well-nourished, and in no distress.  HENT:  Head: Normocephalic and atraumatic.  Cardiovascular: Normal rate, regular rhythm and normal heart sounds.  Exam reveals no gallop and no friction rub.   No murmur heard. Pulmonary/Chest: Effort normal and breath sounds normal. No respiratory distress. He has no wheezes. He has no rales.  Abdominal: Soft. Bowel sounds are normal. He exhibits no distension. There is no tenderness. There is no rebound.  Easily reducible left inguinal hernia. Weak right inguinal floor.  Genitourinary: Penis normal.  Genitourinary Comments: Testicles within normal limits.  Neurological: He is alert and oriented to person, place, and time.  Uses a cane to assist in ambulation.  Skin: Skin is warm and dry.  Vitals reviewed.  Dr. Rayna Sexton notes reviewed. Assessment  Left inguinal hernia Plan   Patient scheduled for a left inguinal herniorrhaphy with mesh on 04/21/2017. The risks and benefits of the procedure including bleeding, infection, mesh use, and the possibility of recurrence of the hernia were fully explained to the patient, who gave informed consent.

## 2017-04-14 NOTE — Patient Instructions (Signed)
Craig Drake  04/14/2017     @PREFPERIOPPHARMACY @   Your procedure is scheduled on 04/21/2017.  Report to [name] at 6:15 A.M.  Call this number if you have problems the morning of surgery:  [phone]   Remember:  Do not eat food or drink liquids after midnight.  Take these medicines the morning of surgery with A SIP OF WATER Baclofen, Flexeril, Valium, Embeda (morphine), Gabapentin, Remeron, Lisinopril,  Protonix, Zoloft   Do not wear jewelry, make-up or nail polish.  Do not wear lotions, powders, or perfumes, or deoderant.  Do not shave 48 hours prior to surgery.  Men may shave face and neck.  Do not bring valuables to the hospital.  [facility] is not responsible for any belongings or valuables.  Contacts, dentures or bridgework may not be worn into surgery.  Leave your suitcase in the car.  After surgery it may be brought to your room.  For patients admitted to the hospital, discharge time will be determined by your treatment team.  Patients discharged the day of surgery will not be allowed to drive home.   Please read over the following fact sheets that you were given. Surgical Site Infection Prevention and Anesthesia Post-op Instructions     PATIENT INSTRUCTIONS POST-ANESTHESIA  IMMEDIATELY FOLLOWING SURGERY:  Do not drive or operate machinery for the first twenty four hours after surgery.  Do not make any important decisions for twenty four hours after surgery or while taking narcotic pain medications or sedatives.  If you develop intractable nausea and vomiting or a severe headache please notify your doctor immediately.  FOLLOW-UP:  Please make an appointment with your surgeon as instructed. You do not need to follow up with anesthesia unless specifically instructed to do so.  WOUND CARE INSTRUCTIONS (if applicable):  Keep a dry clean dressing on the anesthesia/puncture wound site if there is drainage.  Once the wound has quit draining you may leave it  open to air.  Generally you should leave the bandage intact for twenty four hours unless there is drainage.  If the epidural site drains for more than 36-48 hours please call the anesthesia department.  QUESTIONS?:  Please feel free to call your physician or the hospital operator if you have any questions, and they will be happy to assist you.      Inguinal Hernia, Adult An inguinal hernia is when fat or the intestines push through the area where the leg meets the lower abdomen (groin) and create a rounded lump (bulge). This condition develops over time. There are three types of inguinal hernias. These types include:  Hernias that can be pushed back into the belly (are reducible).  Hernias that are not reducible (are incarcerated).  Hernias that are not reducible and lose their blood supply (are strangulated). This type of hernia requires emergency surgery.  What are the causes? This condition is caused by having a weak spot in the muscles or tissue. This weakness lets the hernia poke through. This condition can be triggered by:  Suddenly straining the muscles of the lower abdomen.  Lifting heavy objects.  Straining to have a bowel movement. Difficult bowel movements (constipation) can lead to this.  Coughing.  What increases the risk? This condition is more likely to develop in:  Men.  Pregnant women.  People who: ? Are overweight. ? Work in jobs that require long periods of standing or heavy lifting. ? Have had an inguinal hernia before. ? Smoke or have lung  disease. These factors can lead to long-lasting (chronic) coughing.  What are the signs or symptoms? Symptoms can depend on the size of the hernia. Often, a small inguinal hernia has no symptoms. Symptoms of a larger hernia include:  A lump in the groin. This is easier to see when the person is standing. It might not be visible when he or she is lying down.  Pain or burning in the groin. This occurs especially when  lifting, straining, or coughing.  A dull ache or a feeling of pressure in the groin.  A lump in the scrotum in men.  Symptoms of a strangulated inguinal hernia can include:  A bulge in the groin that is very painful and tender to the touch.  A bulge that turns red or purple.  Fever, nausea, and vomiting.  The inability to have a bowel movement or to pass gas.  How is this diagnosed? This condition is diagnosed with a medical history and physical exam. Your health care provider may feel your groin area and ask you to cough. How is this treated? Treatment for this condition varies depending on the size of your hernia and whether you have symptoms. If you do not have symptoms, your health care provider may have you watch your hernia carefully and come in for follow-up visits. If your hernia is larger or if you have symptoms, your treatment will include surgery. Follow these instructions at home: Lifestyle  Drink enough fluid to keep your urine clear or pale yellow.  Eat a diet that includes a lot of fiber. Eat plenty of fruits, vegetables, and whole grains. Talk with your health care provider if you have questions.  Avoid lifting heavy objects.  Avoid standing for long periods of time.  Do not use tobacco products, including cigarettes, chewing tobacco, or e-cigarettes. If you need help quitting, ask your health care provider.  Maintain a healthy weight. General instructions  Do not try to force the hernia back in.  Watch your hernia for any changes in color or size. Let your health care provider know if any changes occur.  Take over-the-counter and prescription medicines only as told by your health care provider.  Keep all follow-up visits as told by your health care provider. This is important. Contact a health care provider if:  You have a fever.  You have new symptoms.  Your symptoms get worse. Get help right away if:  You have pain in the groin that suddenly gets  worse.  A bulge in the groin gets bigger suddenly and does not go down.  You are a man and you have a sudden pain in the scrotum, or the size of your scrotum suddenly changes.  A bulge in the groin area becomes red or purple and is painful to the touch.  You have nausea or vomiting that does not go away.  You feel your heart beating a lot more quickly than normal.  You cannot have a bowel movement or pass gas. This information is not intended to replace advice given to you by your health care provider. Make sure you discuss any questions you have with your health care provider. Document Released: 10/25/2008 Document Revised: 11/14/2015 Document Reviewed: 04/18/2014 Elsevier Interactive Patient Education  2018 [employer].

## 2017-04-15 ENCOUNTER — Encounter (HOSPITAL_COMMUNITY): Payer: Self-pay

## 2017-04-15 ENCOUNTER — Encounter (HOSPITAL_COMMUNITY)
Admission: RE | Admit: 2017-04-15 | Discharge: 2017-04-15 | Disposition: A | Discharge status: Home / Self Care | Payer: Medicare HMO | Source: Ambulatory Visit | Attending: General Surgery | Admitting: General Surgery

## 2017-04-15 DIAGNOSIS — K4090 Unilateral inguinal hernia, without obstruction or gangrene, not specified as recurrent: Secondary | ICD-10-CM | POA: Diagnosis not present

## 2017-04-15 DIAGNOSIS — Z01812 Encounter for preprocedural laboratory examination: Secondary | ICD-10-CM | POA: Insufficient documentation

## 2017-04-15 LAB — CBC WITH DIFFERENTIAL/PLATELET
BASOS ABS: 0 10*3/uL (ref 0.0–0.1)
Basophils Relative: 0 %
Eosinophils Absolute: 0.4 10*3/uL (ref 0.0–0.7)
Eosinophils Relative: 4 %
HEMATOCRIT: 45.3 % (ref 39.0–52.0)
HEMOGLOBIN: 14.9 g/dL (ref 13.0–17.0)
LYMPHS PCT: 19 %
Lymphs Abs: 1.5 10*3/uL (ref 0.7–4.0)
MCH: 27.7 pg (ref 26.0–34.0)
MCHC: 32.9 g/dL (ref 30.0–36.0)
MCV: 84.2 fL (ref 78.0–100.0)
MONO ABS: 0.9 10*3/uL (ref 0.1–1.0)
MONOS PCT: 11 %
NEUTROS ABS: 5.4 10*3/uL (ref 1.7–7.7)
Neutrophils Relative %: 66 %
Platelets: 200 10*3/uL (ref 150–400)
RBC: 5.38 MIL/uL (ref 4.22–5.81)
RDW: 13.4 % (ref 11.5–15.5)
WBC: 8.2 10*3/uL (ref 4.0–10.5)

## 2017-04-15 LAB — BASIC METABOLIC PANEL
ANION GAP: 10 (ref 5–15)
BUN: 10 mg/dL (ref 6–20)
CO2: 27 mmol/L (ref 22–32)
Calcium: 9.4 mg/dL (ref 8.9–10.3)
Chloride: 102 mmol/L (ref 101–111)
Creatinine, Ser: 0.91 mg/dL (ref 0.61–1.24)
GFR calc Af Amer: >60 mL/min (ref >60)
GFR calc non Af Amer: >60 mL/min (ref >60)
GLUCOSE: 98 mg/dL (ref 65–99)
POTASSIUM: 3.9 mmol/L (ref 3.5–5.1)
Sodium: 139 mmol/L (ref 135–145)

## 2017-04-21 ENCOUNTER — Ambulatory Visit (HOSPITAL_COMMUNITY): Payer: Medicare HMO | Admitting: Anesthesiology

## 2017-04-21 ENCOUNTER — Encounter (HOSPITAL_COMMUNITY)
Admission: RE | Disposition: A | Discharge status: Home / Self Care | Payer: Self-pay | Source: Ambulatory Visit | Attending: General Surgery

## 2017-04-21 ENCOUNTER — Ambulatory Visit (HOSPITAL_COMMUNITY)
Admission: RE | Admit: 2017-04-21 | Discharge: 2017-04-21 | Disposition: A | Discharge status: Home / Self Care | Payer: Medicare HMO | Source: Ambulatory Visit | Attending: General Surgery | Admitting: General Surgery

## 2017-04-21 ENCOUNTER — Encounter (HOSPITAL_COMMUNITY): Payer: Self-pay | Admitting: *Deleted

## 2017-04-21 DIAGNOSIS — Z800 Family history of malignant neoplasm of digestive organs: Secondary | ICD-10-CM | POA: Insufficient documentation

## 2017-04-21 DIAGNOSIS — Z7982 Long term (current) use of aspirin: Secondary | ICD-10-CM | POA: Insufficient documentation

## 2017-04-21 DIAGNOSIS — I10 Essential (primary) hypertension: Secondary | ICD-10-CM | POA: Insufficient documentation

## 2017-04-21 DIAGNOSIS — E7800 Pure hypercholesterolemia, unspecified: Secondary | ICD-10-CM | POA: Insufficient documentation

## 2017-04-21 DIAGNOSIS — Z888 Allergy status to other drugs, medicaments and biological substances status: Secondary | ICD-10-CM | POA: Diagnosis not present

## 2017-04-21 DIAGNOSIS — F329 Major depressive disorder, single episode, unspecified: Secondary | ICD-10-CM | POA: Insufficient documentation

## 2017-04-21 DIAGNOSIS — K219 Gastro-esophageal reflux disease without esophagitis: Secondary | ICD-10-CM | POA: Insufficient documentation

## 2017-04-21 DIAGNOSIS — Z8249 Family history of ischemic heart disease and other diseases of the circulatory system: Secondary | ICD-10-CM | POA: Diagnosis not present

## 2017-04-21 DIAGNOSIS — Z981 Arthrodesis status: Secondary | ICD-10-CM | POA: Insufficient documentation

## 2017-04-21 DIAGNOSIS — Z79899 Other long term (current) drug therapy: Secondary | ICD-10-CM | POA: Diagnosis not present

## 2017-04-21 DIAGNOSIS — Z9889 Other specified postprocedural states: Secondary | ICD-10-CM | POA: Diagnosis not present

## 2017-04-21 DIAGNOSIS — G894 Chronic pain syndrome: Secondary | ICD-10-CM | POA: Insufficient documentation

## 2017-04-21 DIAGNOSIS — F419 Anxiety disorder, unspecified: Secondary | ICD-10-CM | POA: Diagnosis not present

## 2017-04-21 DIAGNOSIS — Z886 Allergy status to analgesic agent status: Secondary | ICD-10-CM | POA: Diagnosis not present

## 2017-04-21 DIAGNOSIS — K4090 Unilateral inguinal hernia, without obstruction or gangrene, not specified as recurrent: Secondary | ICD-10-CM

## 2017-04-21 DIAGNOSIS — M549 Dorsalgia, unspecified: Secondary | ICD-10-CM | POA: Insufficient documentation

## 2017-04-21 DIAGNOSIS — K449 Diaphragmatic hernia without obstruction or gangrene: Secondary | ICD-10-CM | POA: Insufficient documentation

## 2017-04-21 DIAGNOSIS — Z801 Family history of malignant neoplasm of trachea, bronchus and lung: Secondary | ICD-10-CM | POA: Diagnosis not present

## 2017-04-21 DIAGNOSIS — G4730 Sleep apnea, unspecified: Secondary | ICD-10-CM | POA: Insufficient documentation

## 2017-04-21 HISTORY — PX: INGUINAL HERNIA REPAIR: SHX194

## 2017-04-21 SURGERY — REPAIR, HERNIA, INGUINAL, ADULT
Anesthesia: General | Laterality: Left

## 2017-04-21 MED ORDER — FENTANYL CITRATE (PF) 100 MCG/2ML IJ SOLN
INTRAMUSCULAR | Status: DC | PRN
  Administered 2017-04-21: 50 ug via INTRAVENOUS
  Administered 2017-04-21: 25 ug via INTRAVENOUS
  Administered 2017-04-21 (×2): 50 ug via INTRAVENOUS
  Administered 2017-04-21: 25 ug via INTRAVENOUS

## 2017-04-21 MED ORDER — ONDANSETRON HCL 4 MG/2ML IJ SOLN
INTRAMUSCULAR | Status: AC
  Filled 2017-04-21: qty 2

## 2017-04-21 MED ORDER — BUPIVACAINE LIPOSOME 1.3 % IJ SUSP
INTRAMUSCULAR | Status: AC
  Filled 2017-04-21: qty 20

## 2017-04-21 MED ORDER — LACTATED RINGERS IV SOLN
INTRAVENOUS | Status: DC | PRN
  Administered 2017-04-21 (×2): via INTRAVENOUS

## 2017-04-21 MED ORDER — FENTANYL CITRATE (PF) 100 MCG/2ML IJ SOLN
INTRAMUSCULAR | Status: AC
  Filled 2017-04-21: qty 2

## 2017-04-21 MED ORDER — CEFAZOLIN SODIUM-DEXTROSE 2-4 GM/100ML-% IV SOLN
INTRAVENOUS | Status: AC
Start: 2017-04-21 — End: 2017-04-21
  Filled 2017-04-21: qty 100

## 2017-04-21 MED ORDER — LIDOCAINE HCL (CARDIAC) 20 MG/ML IV SOLN
INTRAVENOUS | Status: DC | PRN
  Administered 2017-04-21: 50 mg via INTRATRACHEAL

## 2017-04-21 MED ORDER — CHLORHEXIDINE GLUCONATE CLOTH 2 % EX PADS: 6.0000 | MEDICATED_PAD | Freq: Once | CUTANEOUS | Status: DC

## 2017-04-21 MED ORDER — SODIUM CHLORIDE 0.9% FLUSH
INTRAVENOUS | Status: AC
  Filled 2017-04-21: qty 10

## 2017-04-21 MED ORDER — FENTANYL CITRATE (PF) 100 MCG/2ML IJ SOLN
25.0000 ug | INTRAMUSCULAR | Status: AC | PRN
  Administered 2017-04-21 (×2): 25 ug via INTRAVENOUS

## 2017-04-21 MED ORDER — LIDOCAINE HCL (PF) 1 % IJ SOLN
INTRAMUSCULAR | Status: AC
  Filled 2017-04-21: qty 5

## 2017-04-21 MED ORDER — PROPOFOL 10 MG/ML IV BOLUS
INTRAVENOUS | Status: DC | PRN
  Administered 2017-04-21: 200 mg via INTRAVENOUS

## 2017-04-21 MED ORDER — MIDAZOLAM HCL 2 MG/2ML IJ SOLN
INTRAMUSCULAR | Status: AC
  Filled 2017-04-21: qty 2

## 2017-04-21 MED ORDER — CEFAZOLIN SODIUM-DEXTROSE 1-4 GM/50ML-% IV SOLN
INTRAVENOUS | Status: AC
  Filled 2017-04-21: qty 50

## 2017-04-21 MED ORDER — MIDAZOLAM HCL 2 MG/2ML IJ SOLN
1.0000 mg | INTRAMUSCULAR | Status: DC | PRN
  Administered 2017-04-21: 2 mg via INTRAVENOUS

## 2017-04-21 MED ORDER — BUPIVACAINE LIPOSOME 1.3 % IJ SUSP
INTRAMUSCULAR | Status: DC | PRN
  Administered 2017-04-21: 20 mL

## 2017-04-21 MED ORDER — DEXTROSE 5 % IV SOLN
3.0000 g | INTRAVENOUS | Status: AC
  Administered 2017-04-21: 3 g via INTRAVENOUS
  Filled 2017-04-21: qty 3000

## 2017-04-21 MED ORDER — OXYCODONE-ACETAMINOPHEN 7.5-325 MG PO TABS: 1.0000 | ORAL_TABLET | ORAL | 0 refills | Status: DC | PRN

## 2017-04-21 MED ORDER — PROMETHAZINE HCL 25 MG/ML IJ SOLN
12.5000 mg | Freq: Once | INTRAMUSCULAR | Status: AC
  Administered 2017-04-21: 12.5 mg via INTRAVENOUS
  Filled 2017-04-21: qty 1

## 2017-04-21 MED ORDER — DEXAMETHASONE SODIUM PHOSPHATE 10 MG/ML IJ SOLN
INTRAMUSCULAR | Status: DC | PRN
  Administered 2017-04-21: 4 mg via INTRAVENOUS

## 2017-04-21 MED ORDER — HYDROMORPHONE HCL 1 MG/ML IJ SOLN
0.2500 mg | INTRAMUSCULAR | Status: DC | PRN
  Administered 2017-04-21 (×3): 0.5 mg via INTRAVENOUS
  Filled 2017-04-21 (×2): qty 1

## 2017-04-21 MED ORDER — ONDANSETRON HCL 4 MG/2ML IJ SOLN
4.0000 mg | Freq: Once | INTRAMUSCULAR | Status: AC
Start: 2017-04-21 — End: 2017-04-21
  Administered 2017-04-21: 4 mg via INTRAVENOUS

## 2017-04-21 MED ORDER — PROPOFOL 10 MG/ML IV BOLUS
INTRAVENOUS | Status: AC
  Filled 2017-04-21: qty 20

## 2017-04-21 MED ORDER — DEXAMETHASONE SODIUM PHOSPHATE 4 MG/ML IJ SOLN
INTRAMUSCULAR | Status: AC
  Filled 2017-04-21: qty 1

## 2017-04-21 MED ORDER — LACTATED RINGERS IV SOLN
INTRAVENOUS | Status: DC
  Administered 2017-04-21: 1000 mL via INTRAVENOUS

## 2017-04-21 MED ORDER — ONDANSETRON HCL 4 MG/2ML IJ SOLN
4.0000 mg | Freq: Once | INTRAMUSCULAR | Status: AC
  Administered 2017-04-21: 4 mg via INTRAVENOUS

## 2017-04-21 MED ORDER — SODIUM CHLORIDE 0.9 % IR SOLN
Status: DC | PRN
  Administered 2017-04-21: 1000 mL

## 2017-04-21 SURGICAL SUPPLY — 39 items
ADH SKN CLS APL DERMABOND .7 (GAUZE/BANDAGES/DRESSINGS) ×1
BAG HAMPER (MISCELLANEOUS) ×2 IMPLANT
CLOTH BEACON ORANGE TIMEOUT ST (SAFETY) ×2 IMPLANT
COVER LIGHT HANDLE STERIS (MISCELLANEOUS) ×4 IMPLANT
DERMABOND ADVANCED (GAUZE/BANDAGES/DRESSINGS) ×1
DERMABOND ADVANCED .7 DNX12 (GAUZE/BANDAGES/DRESSINGS) ×1 IMPLANT
DRAIN PENROSE 18X1/2 LTX STRL (DRAIN) ×2 IMPLANT
ELECT REM PT RETURN 9FT ADLT (ELECTROSURGICAL) ×2
ELECTRODE REM PT RTRN 9FT ADLT (ELECTROSURGICAL) ×1 IMPLANT
GLOVE BIOGEL M 7.0 STRL (GLOVE) ×2 IMPLANT
GLOVE BIOGEL PI IND STRL 6.5 (GLOVE) ×1 IMPLANT
GLOVE BIOGEL PI IND STRL 7.0 (GLOVE) ×2 IMPLANT
GLOVE BIOGEL PI INDICATOR 6.5 (GLOVE) ×1
GLOVE BIOGEL PI INDICATOR 7.0 (GLOVE) ×2
GLOVE ECLIPSE 6.5 STRL STRAW (GLOVE) ×2 IMPLANT
GLOVE SURG SS PI 7.5 STRL IVOR (GLOVE) ×2 IMPLANT
GOWN STRL REUS W/ TWL XL LVL3 (GOWN DISPOSABLE) ×1 IMPLANT
GOWN STRL REUS W/TWL LRG LVL3 (GOWN DISPOSABLE) ×4 IMPLANT
GOWN STRL REUS W/TWL XL LVL3 (GOWN DISPOSABLE) ×2
INST SET MINOR GENERAL (KITS) ×2 IMPLANT
KIT ROOM TURNOVER APOR (KITS) ×2 IMPLANT
LIGASURE IMPACT 36 18CM CVD LR (INSTRUMENTS) ×2 IMPLANT
MANIFOLD NEPTUNE II (INSTRUMENTS) ×2 IMPLANT
MESH HERNIA 1.6X1.9 PLUG LRG (Mesh General) ×1 IMPLANT
MESH HERNIA PLUG LRG (Mesh General) ×1 IMPLANT
NEEDLE HYPO 21X1.5 SAFETY (NEEDLE) ×2 IMPLANT
NS IRRIG 1000ML POUR BTL (IV SOLUTION) ×2 IMPLANT
PACK MINOR (CUSTOM PROCEDURE TRAY) ×2 IMPLANT
PAD ARMBOARD 7.5X6 YLW CONV (MISCELLANEOUS) ×2 IMPLANT
SET BASIN LINEN APH (SET/KITS/TRAYS/PACK) ×2 IMPLANT
SUT NOVA NAB GS-22 2 2-0 T-19 (SUTURE) ×4 IMPLANT
SUT SILK 3 0 (SUTURE) ×2
SUT SILK 3-0 18XBRD TIE 12 (SUTURE) ×1 IMPLANT
SUT VIC AB 2-0 CT1 27 (SUTURE) ×2
SUT VIC AB 2-0 CT1 TAPERPNT 27 (SUTURE) ×1 IMPLANT
SUT VIC AB 3-0 SH 27 (SUTURE) ×2
SUT VIC AB 3-0 SH 27X BRD (SUTURE) ×1 IMPLANT
SUT VIC AB 4-0 PS2 27 (SUTURE) ×2 IMPLANT
SYR 20CC LL (SYRINGE) ×2 IMPLANT

## 2017-04-21 NOTE — Addendum Note (Signed)
Addendum  created 04/21/17 0850 by Laretta Alstrom, CRNA   Anesthesia Intra Flowsheets edited

## 2017-04-21 NOTE — Op Note (Signed)
Patient:  Craig Drake  DOB:  June 08, 1966  MRN:  [id number]   Preop Diagnosis: Left inguinal hernia  Postop Diagnosis: Same  Procedure: Left inguinal herniorrhaphy with mesh  Surgeon: Aviva Signs, MD  Assistant: Blake Divine, MD  Anes: General  Indications: Patient is a 51 year old white male who presents with a symptomatic left inguinal hernia.  Risks and benefits of the procedure including bleeding, infection, mesh use, the possibility of recurrence of the hernia were fully explained to the patient, who gave informed consent.  Procedure note: The patient was placed in the supine position.  After general anesthesia was administered, the left groin region was prepped and draped using the usual sterile technique with technic care.  Surgical site confirmation was performed.  Incision was made in the left groin region down to the external oblique neurosis.  The aponeurosis was incised to the external ring.  A Penrose drain was placed around the spermatic cord.  The vase deferens was noted within the spermatic cord.  The ileal inguinal nerve was identified and retracted inferiorly from the operative field.  A large lipoma of the cord was found.  This was freed away from the spermatic cord up to the peritoneal reflection and excised using the LigaSure.  The indirect hernia sac was found and this was freed away from the spermatic cord up to the peritoneal reflection and inverted.  A large Bard PerFix plug was then inserted.  An onlay patch was then placed along the floor of the inguinal canal and secured superiorly to the conjoined tendon and inferiorly to the shelving edge of Poupart's ligament using 2-0 Novafil interrupted sutures.  The internal ring was re-created using a 2-0 Novafil interrupted suture.  The external oblique aponeurosis was reapproximated using a 2-0 Vicryl running suture.  Subcutaneous layer was reapproximated using a 3-0 Vicryl interrupted suture.  Exparel was instilled  into the surrounding wound.  The skin was closed using a 4-0 Vicryl subcuticular suture.  Dermabond was applied.  All tape needle counts were correct at the end of the procedure.  The patient was awakened and transferred to PACU in stable condition.  Complications: None  EBL: Minimal  Specimen: None

## 2017-04-21 NOTE — Anesthesia Preprocedure Evaluation (Signed)
Anesthesia Evaluation  Patient identified by MRN, date of birth, ID band Patient awake    Reviewed: Allergy & Precautions, NPO status , Patient's Chart, lab work & pertinent test results  Airway Mallampati: II  TM Distance: >3 FB Neck ROM: Limited   Comment: Limited neck extension Dental  (+) Partial Lower, Partial Upper   Pulmonary sleep apnea ,    breath sounds clear to auscultation       Cardiovascular hypertension, Pt. on medications  Rhythm:Regular Rate:Normal     Neuro/Psych PSYCHIATRIC DISORDERS Anxiety Depression    GI/Hepatic GERD  Medicated and Controlled,  Endo/Other    Renal/GU      Musculoskeletal  (+) Arthritis ,   Abdominal   Peds  Hematology   Anesthesia Other Findings CHRONIC PAIN SYNDROME - Lumbar & Cervical issues  Reproductive/Obstetrics                             Anesthesia Physical Anesthesia Plan  ASA: III  Anesthesia Plan: General   Post-op Pain Management:    Induction: Intravenous  PONV Risk Score and Plan:   Airway Management Planned: LMA  Additional Equipment:   Intra-op Plan:   Post-operative Plan: Extubation in OR  Informed Consent: I have reviewed the patients History and Physical, chart, labs and discussed the procedure including the risks, benefits and alternatives for the proposed anesthesia with the patient or authorized representative who has indicated his/her understanding and acceptance.     Plan Discussed with:   Anesthesia Plan Comments:         Anesthesia Quick Evaluation

## 2017-04-21 NOTE — Discharge Instructions (Signed)
Open Hernia Repair, Adult, Care After °This sheet gives you information about how to care for yourself after your procedure. Your health care provider may also give you more specific instructions. If you have problems or questions, contact your health care provider. °What can I expect after the procedure? °After the procedure, it is common to have: °· Mild discomfort. °· Slight bruising. °· Minor swelling. °· Pain in the abdomen. ° °Follow these instructions at home: °Incision care ° °· Follow instructions from your health care provider about how to take care of your incision area. Make sure you: °? Wash your hands with soap and water before you change your bandage (dressing). If soap and water are not available, use hand sanitizer. °? Change your dressing as told by your health care provider. °? Leave stitches (sutures), skin glue, or adhesive strips in place. These skin closures may need to stay in place for 2 weeks or longer. If adhesive strip edges start to loosen and curl up, you may trim the loose edges. Do not remove adhesive strips completely unless your health care provider tells you to do that. °· Check your incision area every day for signs of infection. Check for: °? More redness, swelling, or pain. °? More fluid or blood. °? Warmth. °? Pus or a bad smell. °Activity °· Do not drive or use heavy machinery while taking prescription pain medicine. Do not drive until your health care provider approves. °· Until your health care provider approves: °? Do not lift anything that is heavier than 10 lb (4.5 kg). °? Do not play contact sports. °· Return to your normal activities as told by your health care provider. Ask your health care provider what activities are safe. °General instructions °· To prevent or treat constipation while you are taking prescription pain medicine, your health care provider may recommend that you: °? Drink enough fluid to keep your urine clear or pale yellow. °? Take over-the-counter or  prescription medicines. °? Eat foods that are high in fiber, such as fresh fruits and vegetables, whole grains, and beans. °? Limit foods that are high in fat and processed sugars, such as fried and sweet foods. °· Take over-the-counter and prescription medicines only as told by your health care provider. °· Do not take tub baths or go swimming until your health care provider approves. °· Keep all follow-up visits as told by your health care provider. This is important. °Contact a health care provider if: °· You develop a rash. °· You have more redness, swelling, or pain around your incision. °· You have more fluid or blood coming from your incision. °· Your incision feels warm to the touch. °· You have pus or a bad smell coming from your incision. °· You have a fever or chills. °· You have blood in your stool (feces). °· You have not had a bowel movement in 2-3 days. °· Your pain is not controlled with medicine. °Get help right away if: °· You have chest pain or shortness of breath. °· You feel light-headed or feel faint. °· You have severe pain. °· You vomit and your pain is worse. °This information is not intended to replace advice given to you by your health care provider. Make sure you discuss any questions you have with your health care provider. °Document Released: 12/26/2004 Document Revised: 12/27/2015 Document Reviewed: 11/20/2015 °Elsevier Interactive Patient Education © 2017 Elsevier Inc. ° °PATIENT INSTRUCTIONS °POST-ANESTHESIA ° °IMMEDIATELY FOLLOWING SURGERY:  Do not drive or operate machinery for the   first twenty four hours after surgery.  Do not make any important decisions for twenty four hours after surgery or while taking narcotic pain medications or sedatives.  If you develop intractable nausea and vomiting or a severe headache please notify your doctor immediately. ° °FOLLOW-UP:  Please make an appointment with your surgeon as instructed. You do not need to follow up with anesthesia unless  specifically instructed to do so. ° °WOUND CARE INSTRUCTIONS (if applicable):  Keep a dry clean dressing on the anesthesia/puncture wound site if there is drainage.  Once the wound has quit draining you may leave it open to air.  Generally you should leave the bandage intact for twenty four hours unless there is drainage.  If the epidural site drains for more than 36-48 hours please call the anesthesia department. ° °QUESTIONS?:  Please feel free to call your physician or the hospital operator if you have any questions, and they will be happy to assist you.    ° ° ° ° °

## 2017-04-21 NOTE — Interval H&P Note (Signed)
History and Physical Interval Note:  04/21/2017 7:17 AM  Craig Drake  has presented today for surgery, with the diagnosis of left inguinal hernia  The various methods of treatment have been discussed with the patient and family. After consideration of risks, benefits and other options for treatment, the patient has consented to  Procedure(s): HERNIA REPAIR INGUINAL ADULT WITH MESH (Left) as a surgical intervention .  The patient's history has been reviewed, patient examined, no change in status, stable for surgery.  I have reviewed the patient's chart and labs.  Questions were answered to the patient's satisfaction.     Aviva Signs

## 2017-04-21 NOTE — Addendum Note (Signed)
Addendum  created 04/21/17 0930 by Laretta Alstrom, CRNA   Charge Capture section accepted

## 2017-04-21 NOTE — Anesthesia Postprocedure Evaluation (Signed)
Anesthesia Post Note  Patient: Craig Drake  Procedure(s) Performed: LEFT INGUINAL HERNIORRHAPHY WITH MESH (Left )  Patient location during evaluation: PACU Anesthesia Type: General Level of consciousness: awake and alert, oriented and patient cooperative Pain management: pain level controlled Vital Signs Assessment: post-procedure vital signs reviewed and stable Respiratory status: spontaneous breathing, nonlabored ventilation and respiratory function stable Cardiovascular status: blood pressure returned to baseline and stable Postop Assessment: no headache and adequate PO intake Anesthetic complications: no     Last Vitals:  Vitals:   04/21/17 0715 04/21/17 0840  BP: 133/83 107/64  Pulse:  79  Resp: 18 11  Temp:    SpO2: 95% 96%    Last Pain:  Vitals:   04/21/17 0840  TempSrc:   PainSc: (P) 9                  Alik Mawson

## 2017-04-21 NOTE — Transfer of Care (Signed)
Immediate Anesthesia Transfer of Care Note  Patient: Craig Drake  Procedure(s) Performed: LEFT INGUINAL HERNIORRHAPHY WITH MESH (Left )  Patient Location: PACU  Anesthesia Type:General  Level of Consciousness: awake, alert , oriented and patient cooperative  Airway & Oxygen Therapy: Patient Spontanous Breathing  Post-op Assessment: Report given to RN and Post -op Vital signs reviewed and stable  Post vital signs: Reviewed and stable  Last Vitals:  Vitals:   04/21/17 0715 04/21/17 0840  BP: 133/83 107/64  Pulse:  79  Resp: 18 11  Temp:    SpO2: 95% 96%    Last Pain:  Vitals:   04/21/17 0646  TempSrc: Oral  PainSc: 7       Patients Stated Pain Goal: 6 (19/01/22 2411)  Complications: No apparent anesthesia complications

## 2017-04-22 ENCOUNTER — Encounter (HOSPITAL_COMMUNITY): Payer: Self-pay | Admitting: General Surgery

## 2017-04-27 ENCOUNTER — Other Ambulatory Visit: Payer: Self-pay | Admitting: General Surgery

## 2017-04-29 ENCOUNTER — Ambulatory Visit (INDEPENDENT_AMBULATORY_CARE_PROVIDER_SITE_OTHER): Payer: Medicare HMO | Admitting: General Surgery

## 2017-04-29 ENCOUNTER — Encounter: Payer: Self-pay | Admitting: General Surgery

## 2017-04-29 VITALS — BP 147/93 | HR 82 | Temp 98.0°F | Resp 18 | Ht 74.0 in | Wt 268.0 lb

## 2017-04-29 DIAGNOSIS — Z09 Encounter for follow-up examination after completed treatment for conditions other than malignant neoplasm: Secondary | ICD-10-CM

## 2017-04-29 MED ORDER — OXYCODONE-ACETAMINOPHEN 7.5-325 MG PO TABS: 1.0000 | ORAL_TABLET | ORAL | 0 refills | Status: DC | PRN

## 2017-04-29 NOTE — Progress Notes (Signed)
Subjective:     Craig Drake  Status post left inguinal herniorrhaphy with mesh.  Having intermittent pain with movement.  Pain level 7 out of 10.  He states that this pain is different from his preoperative pain.  He is pleased with the results. Objective:    BP (!) 147/93   Pulse 82   Temp 98 F (36.7 C)   Resp 18   Ht 6\' 2"  (1.88 m)   Wt 268 lb (121.6 kg)   BMI 34.41 kg/m   General:  alert, cooperative and no distress  Abdomen soft, incision healing well.     Assessment:    Doing well postoperatively.    Plan:   Percocet reordered for incisional pain.  I told him this was his last prescription of narcotic.  He understands and agrees.  Increase activity as able.  Follow-up as needed.

## 2017-05-06 ENCOUNTER — Other Ambulatory Visit: Payer: Self-pay | Admitting: General Surgery

## 2017-05-06 MED ORDER — GABAPENTIN 300 MG PO CAPS: 300.0000 mg | ORAL_CAPSULE | Freq: Three times a day (TID) | ORAL | 0 refills | Status: DC

## 2018-04-21 ENCOUNTER — Encounter: Payer: Self-pay | Admitting: "Endocrinology

## 2018-08-23 ENCOUNTER — Ambulatory Visit (INDEPENDENT_AMBULATORY_CARE_PROVIDER_SITE_OTHER): Payer: Medicare HMO | Admitting: "Endocrinology

## 2018-08-23 ENCOUNTER — Encounter: Payer: Self-pay | Admitting: "Endocrinology

## 2018-08-23 VITALS — BP 113/79 | HR 89 | Resp 12 | Ht 74.0 in | Wt 295.2 lb

## 2018-08-23 DIAGNOSIS — Z6837 Body mass index (BMI) 37.0-37.9, adult: Secondary | ICD-10-CM | POA: Diagnosis not present

## 2018-08-23 DIAGNOSIS — E039 Hypothyroidism, unspecified: Secondary | ICD-10-CM | POA: Insufficient documentation

## 2018-08-23 NOTE — Progress Notes (Signed)
Endocrinology Consult Note                                            08/23/2018, 4:38 PM   Subjective:    Patient ID: Craig Drake, male    DOB: 11-09-1965, PCP Deloria Lair., MD   Past Medical History:  Diagnosis Date  . Anxiety    and atypical chest pain  . Chronic back pain   . DDD (degenerative disc disease)   . Depression   . GERD (gastroesophageal reflux disease)   . Hemorrhoids   . Hypercholesteremia   . Labile hypertension   . Multilevel degenerative disc disease   . Sleep apnea    does not wear CPAP   Past Surgical History:  Procedure Laterality Date  . BIOPSY  10/07/2015   Procedure: BIOPSY;  Surgeon: Daneil Dolin, MD;  Location: [facility];  Service: Endoscopy;;  esophageal bx;   Marland Kitchen COLONOSCOPY WITH PROPOFOL N/A 10/07/2015   Dr.Rourk- diverticulosis in the entire examined colon, internal hemorrhoids, the examination was o/w normal  . ESOPHAGOGASTRODUODENOSCOPY (EGD) WITH PROPOFOL N/A 10/07/2015   Dr.Rourk- esophageal mucosa changes, dilated, small hiatal hernia, the examination was o/w normal bx= benign squamous mucosa with minimal reactive changes  . HEMORRHOID BANDING  2017   Dr.Rourk  . INGUINAL HERNIA REPAIR Left 04/21/2017   Procedure: LEFT INGUINAL HERNIORRHAPHY WITH MESH;  Surgeon: Aviva Signs, MD;  Location: [facility];  Service: General;  Laterality: Left;  . KNEE ARTHROSCOPY Left   . MALONEY DILATION N/A 10/07/2015   Procedure: Venia Minks DILATION;  Surgeon: Daneil Dolin, MD;  Location: [facility];  Service: Endoscopy;  Laterality: N/A;  . MANDIBLE FRACTURE SURGERY     wired shut after a fall  . neck fusion     Social History   Socioeconomic History  . Marital status: Divorced    Spouse name: Not on file  . Number of children: Not on file  . Years of education: Not on file  . Highest education level: Not on file  Occupational History  . Not on file  Social Needs  . Financial resource strain: Not on file  . Food insecurity:     Worry: Not on file    Inability: Not on file  . Transportation needs:    Medical: Not on file    Non-medical: Not on file  Tobacco Use  . Smoking status: Never Smoker  . Smokeless tobacco: Never Used  . Tobacco comment: Never smoked  Substance and Sexual Activity  . Alcohol use: No    Alcohol/week: 0.0 standard drinks  . Drug use: No  . Sexual activity: Never    Birth control/protection: None  Lifestyle  . Physical activity:    Days per week: Not on file    Minutes per session: Not on file  . Stress: Not on file  Relationships  . Social connections:    Talks on phone: Not on file    Gets together: Not on file    Attends religious service: Not on file    Active member of club or organization: Not on file    Attends meetings of clubs or organizations: Not on file    Relationship status: Not on file  Other Topics Concern  . Not on file  Social History Narrative  . Not on file  Outpatient Encounter Medications as of 08/23/2018  Medication Sig  . allopurinol (ZYLOPRIM) 100 MG tablet Take 100 mg by mouth daily.  Marland Kitchen aspirin 81 MG tablet Take 81 mg by mouth daily.  Marland Kitchen atorvastatin (LIPITOR) 40 MG tablet Take 1 tablet by mouth at bedtime.  Marland Kitchen levothyroxine (SYNTHROID, LEVOTHROID) 100 MCG tablet Take 100 mcg by mouth daily before breakfast.  . lisinopril (PRINIVIL,ZESTRIL) 20 MG tablet Take 20 mg by mouth daily as needed (blood pressure).   . morphine (MS CONTIN) 30 MG 12 hr tablet Take 30 mg by mouth every 12 (twelve) hours.  Marland Kitchen oxyCODONE-acetaminophen (PERCOCET) 7.5-325 MG tablet Take 1 tablet every 4 (four) hours as needed by mouth.  . pantoprazole (PROTONIX) 40 MG tablet Take 1 tablet by mouth daily.  . [DISCONTINUED] colchicine 0.6 MG tablet Take 2 tablets by mouth daily as needed (gout flare up).   . [DISCONTINUED] diazepam (VALIUM) 5 MG tablet Take 5 mg by mouth 2 (two) times daily. Reported on 11/08/2015  . [DISCONTINUED] EMBEDA 30-1.2 MG CPCR Take 1 capsule by mouth daily.  .  [DISCONTINUED] gabapentin (NEURONTIN) 300 MG capsule Take 1 capsule (300 mg total) 3 (three) times daily by mouth. (Patient not taking: Reported on 08/23/2018)  . [DISCONTINUED] mirtazapine (REMERON) 15 MG tablet Take 15 mg by mouth at bedtime.   No facility-administered encounter medications on file as of 08/23/2018.    ALLERGIES: Allergies  Allergen Reactions  . Ibuprofen   . Lyrica [Pregabalin]     swelling    VACCINATION STATUS:  There is no immunization history on file for this patient.  HPI [name] is 53 y.o. male who presents today with a medical history as above. he is being seen in consultation for weight management requested by Deloria Lair., MD.  This patient was seen in the past in this practice for hypogonadism which required testosterone replacement.  He did not return for follow-up, this time returns with a new concern of progressive weight gain.  He has gained approximately 40 pounds over the last 5 years progressively.  He has not attempted any weight loss measures.  He cannot exercise optimally due to diffuse musculoskeletal pain. -He insists that he does not eat nearly enough but cannot understand why his weight is increasing over time.  He is asking if human growth hormone would help him for weight control.  He is not currently working with any dietitian. -He has hypothyroidism on levothyroxine 100 mcg p.o. every morning.  He reports compliance to his medication. -He was never diagnosed with diabetes.  He is on treatment for hypertension with lisinopril as well as on treatment for hyperlipidemia with atorvastatin.  He is currently on oxycodone 7.5/325 1 tablet every 4 hours.   Review of Systems  Constitutional: +recent weight gain, + fatigue, no subjective hyperthermia, no subjective hypothermia Eyes: no blurry vision, no xerophthalmia ENT: no sore throat, no nodules palpated in throat, no dysphagia/odynophagia, no hoarseness Cardiovascular: no Chest Pain, no  Shortness of Breath, no palpitations, no leg swelling Respiratory: no cough, no shortness of breath Gastrointestinal: no Nausea/Vomiting/Diarhhea Musculoskeletal: no muscle/joint aches Skin: no rashes Neurological: no tremors, no numbness, no tingling, no dizziness Psychiatric: no depression, no anxiety  Objective:    BP 113/79   Pulse 89   Resp 12   Ht 6\' 2"  (1.88 m)   Wt 295 lb 3.2 oz (133.9 kg)   SpO2 97%   BMI 37.90 kg/m   Wt Readings from Last 3 Encounters:  08/23/18 295 lb 3.2 oz (133.9 kg)  04/29/17 268 lb (121.6 kg)  04/21/17 274 lb (124.3 kg)    Physical Exam  Constitutional: + Obese for height, not in acute distress, normal state of mind, +walks with a cane. Eyes: PERRLA, EOMI, no exophthalmos ENT: moist mucous membranes, no gross thyromegaly, no gross cervical lymphadenopathy Cardiovascular: normal precordial activity, Regular Rate and Rhythm, no Murmur/Rubs/Gallops Respiratory:  adequate breathing efforts, no gross chest deformity, Clear to auscultation bilaterally Gastrointestinal: abdomen soft, Non -tender, No distension, Bowel Sounds present, no gross organomegaly Musculoskeletal: no gross deformities, strength intact in all four extremities. Skin: moist, warm, no rashes Neurological: no tremor with outstretched hands, Deep tendon reflexes normal in bilateral lower extremities.  CMP ( most recent) CMP     Component Value Date/Time   NA 139 04/15/2017 1213   K 3.9 04/15/2017 1213   CL 102 04/15/2017 1213   CO2 27 04/15/2017 1213   GLUCOSE 98 04/15/2017 1213   BUN 10 04/15/2017 1213   CREATININE 0.91 04/15/2017 1213   CALCIUM 9.4 04/15/2017 1213   PROT 7.6 12/31/2015 1309   ALBUMIN 4.3 12/31/2015 1309   AST 74 (H) 12/31/2015 1309   ALT 158 (H) 12/31/2015 1309   ALKPHOS 111 12/31/2015 1309   BILITOT 0.4 12/31/2015 1309   GFRNONAA >60 04/15/2017 1213   GFRAA >60 04/15/2017 1213      Assessment & Plan:   1. Class 2 severe obesity due to excess  calories with serious comorbidity and body mass index (BMI) of 37.0 to 37.9 in adult [facility]) 2. Hypothyroidism, unspecified type  - AHIJAH DEVERY  is being seen at a kind request of Deloria Lair., MD. - I have reviewed his available endocrine records and clinically evaluated the patient. - Based on these reviews, he has class II obesity, likely related to positive caloric intake. -Had a long discussion about his options of weight control and emphasized the best option would be optimization of his caloric intake. - Patient admits there is a room for improvement in his diet and drink choices. -  Suggestion is made for him to avoid simple carbohydrates  from his diet including Cakes, Sweet Desserts / Pastries, Ice Cream, Soda (diet and regular), Sweet Tea, Candies, Chips, Cookies, Store Bought Juices, Alcohol in Excess of  1-2 drinks a day, Artificial Sweeteners, and "Sugar-free" Products. This will help patient to have stable blood glucose profile and potentially avoid unintended weight gain.  -I will also arrange for him to sit with a dietitian. -He may benefit from 1 of the recently approved with low-dose medications if his insurance provides coverage such as Qsymia.  This will be considered after his next visit.  He does not not have history of glaucoma, not on antidepressants. -As a last option, he has bariatric surgery as an option as well. He has hypothyroidism, he is advised to continue levothyroxine 100 mcg p.o. every morning until his new labs are reviewed.   - We discussed about the correct intake of his thyroid hormone, on empty stomach at fasting, with water, separated by at least 30 minutes from breakfast and other medications,  and separated by more than 4 hours from calcium, iron, multivitamins, acid reflux medications (PPIs). -Patient is made aware of the fact that thyroid hormone replacement is needed for life, dose to be adjusted by periodic monitoring of thyroid function  tests. -Will be sent to lab today for A1c, vitamin D, thyroid function tests.  -he will return in  10days week to review his repeat labs.  Discussed with him that [facility] is not standardized weight loss treatment option. - I did not initiate any new prescriptions today. - I advised him  to maintain close follow up with Deloria Lair., MD for primary care needs.   - Time spent with the patient: 35 minutes, of which >50% was spent in obtaining information about his symptoms, reviewing his previous labs/studies,  evaluations, and treatments, counseling him about his obesity, hypothyroidism, and developing a plan to confirm the diagnosis and long term treatment based on the latest standards of care/guidelines.    Craig Drake participated in the discussions, expressed understanding, and voiced agreement with the above plans.  All questions were answered to his satisfaction. he is encouraged to contact clinic should he have any questions or concerns prior to his return visit.  Follow up plan: Return in about 10 days (around 09/02/2018) for Labs Today- Non-Fasting Ok.   Glade Lloyd, MD [facility] [address] [place], [place] [address] Phone: [phone]  Fax: [phone]     08/23/2018, 4:38 PM  This note was partially dictated with voice recognition software. Similar sounding words can be transcribed inadequately or may not  be corrected upon review.

## 2018-08-23 NOTE — Patient Instructions (Signed)

## 2018-09-06 ENCOUNTER — Ambulatory Visit: Payer: Medicare HMO | Admitting: "Endocrinology

## 2018-09-14 ENCOUNTER — Encounter: Payer: Self-pay | Admitting: Internal Medicine

## 2018-11-23 ENCOUNTER — Ambulatory Visit (INDEPENDENT_AMBULATORY_CARE_PROVIDER_SITE_OTHER): Payer: Medicare HMO | Admitting: Gastroenterology

## 2018-11-23 ENCOUNTER — Encounter: Payer: Self-pay | Admitting: *Deleted

## 2018-11-23 ENCOUNTER — Encounter: Payer: Self-pay | Admitting: Gastroenterology

## 2018-11-23 ENCOUNTER — Telehealth: Payer: Self-pay | Admitting: *Deleted

## 2018-11-23 ENCOUNTER — Other Ambulatory Visit: Payer: Self-pay

## 2018-11-23 DIAGNOSIS — R7989 Other specified abnormal findings of blood chemistry: Secondary | ICD-10-CM | POA: Insufficient documentation

## 2018-11-23 DIAGNOSIS — R945 Abnormal results of liver function studies: Secondary | ICD-10-CM | POA: Insufficient documentation

## 2018-11-23 NOTE — Patient Instructions (Signed)
1. Please have your labs and ultrasound done. We will contact you with results as available.    Fatty Liver Disease  Fatty liver disease occurs when too much fat has built up in your liver cells. Fatty liver disease is also called hepatic steatosis or steatohepatitis. The liver removes harmful substances from your bloodstream and produces fluids that your body needs. It also helps your body use and store energy from the food you eat. In many cases, fatty liver disease does not cause symptoms or problems. It is often diagnosed when tests are being done for other reasons. However, over time, fatty liver can cause inflammation that may lead to more serious liver problems, such as scarring of the liver (cirrhosis) and liver failure. Fatty liver is associated with insulin resistance, increased body fat, high blood pressure (hypertension), and high cholesterol. These are features of metabolic syndrome and increase your risk for stroke, diabetes, and heart disease. What are the causes? This condition may be caused by:  Drinking too much alcohol.  Poor nutrition.  Obesity.  Cushing's syndrome.  Diabetes.  High cholesterol.  Certain drugs.  Poisons.  Some viral infections.  Pregnancy. What increases the risk? You are more likely to develop this condition if you:  Abuse alcohol.  Are overweight.  Have diabetes.  Have hepatitis.  Have a high triglyceride level.  Are pregnant. What are the signs or symptoms? Fatty liver disease often does not cause symptoms. If symptoms do develop, they can include:  Fatigue.  Weakness.  Weight loss.  Confusion.  Abdominal pain.  Nausea and vomiting.  Yellowing of your skin and the white parts of your eyes (jaundice).  Itchy skin. How is this diagnosed? This condition may be diagnosed by:  A physical exam and medical history.  Blood tests.  Imaging tests, such as an ultrasound, CT scan, or MRI.  A liver biopsy. A small  sample of liver tissue is removed using a needle. The sample is then looked at under a microscope. How is this treated? Fatty liver disease is often caused by other health conditions. Treatment for fatty liver may involve medicines and lifestyle changes to manage conditions such as:  Alcoholism.  High cholesterol.  Diabetes.  Being overweight or obese. Follow these instructions at home:   Do not drink alcohol. If you have trouble quitting, ask your health care provider how to safely quit with the help of medicine or a supervised program. This is important to keep your condition from getting worse.  Eat a healthy diet as told by your health care provider. Ask your health care provider about working with a diet and nutrition specialist (dietitian) to develop an eating plan.  Exercise regularly. This can help you lose weight and control your cholesterol and diabetes. Talk to your health care provider about an exercise plan and which activities are best for you.  Take over-the-counter and prescription medicines only as told by your health care provider.  Keep all follow-up visits as told by your health care provider. This is important. Contact a health care provider if: You have trouble controlling your:  Blood sugar. This is especially important if you have diabetes.  Cholesterol.  Drinking of alcohol. Get help right away if:  You have abdominal pain.  You have jaundice.  You have nausea and vomiting.  You vomit blood or material that looks like coffee grounds.  You have stools that are black, tar-like, or bloody. Summary  Fatty liver disease develops when too much fat builds  up in the cells of your liver.  Fatty liver disease often causes no symptoms or problems. However, over time, fatty liver can cause inflammation that may lead to more serious liver problems, such as scarring of the liver (cirrhosis).  You are more likely to develop this condition if you abuse  alcohol, are pregnant, are overweight, have diabetes, have hepatitis, or have high triglyceride levels.  Contact your health care provider if you have trouble controlling your weight, blood sugar, cholesterol, or drinking of alcohol. This information is not intended to replace advice given to you by your health care provider. Make sure you discuss any questions you have with your health care provider. Document Released: 07/24/2005 Document Revised: 03/17/2017 Document Reviewed: 03/17/2017 Elsevier Interactive Patient Education  2019 [employer].

## 2018-11-23 NOTE — Telephone Encounter (Signed)
Checked [employer] for PA for u/s. Received message "NOTE: This [facility] member does not require prior authorization for OUTPATIENT Radiology through [place] or [place] [facility] at this time."

## 2018-11-23 NOTE — Assessment & Plan Note (Signed)
Pleasant 53 y/o male presenting for further evaluation of abnormal LFTS. Elevated AST/ALT/AP as outlined. BMI 38 but he has also had 40 pound weight gain in the past three years. Chest CT with limited evaluation of liver but concerning for early cirrhosis.   Patient is at risk for NASH. Will check serologies for autoimmune hepatitis, PBC, check hep a/b immune status. Plan for abd u/s to assess liver and to evaluate for cirrhosis.   Instructions for fatty liver: Recommend 1-2# weight loss per week until ideal body weight through exercise & diet. Low fat/cholesterol diet.   Avoid sweets, sodas, fruit juices, sweetened beverages like tea, etc. Gradually increase exercise from 15 min daily up to 1 hr per day 5 days/week. Limit alcohol use.

## 2018-11-23 NOTE — Progress Notes (Signed)
Primary Care Physician:  Deloria Lair., MD  Primary Gastroenterologist:  Garfield Cornea, MD   Chief Complaint  Patient presents with  . Elevated Hepatic Enzymes    HPI:  Craig Drake is a 53 y.o. male here at the request of Dr. Scotty Court for further evaluation of abnormal LFTS. Patient has h/o erosive reflux esophagitis, peptic stricture. Last EGD 2017 with esophageal mucosal changes concerning for eosinophilic esophagitis but bx not c/w, esophagus dilated. Colonoscopy 2017 with internal hemorrhoids, diverticulosis. Next TCS in 2022 due to family history of CRC, mother. He has had hemorrhoid banding with Dr. Gala Romney in 2017.   Patient states back in 08/2018 he was told his liver numbers were elevated. States in the past they have been up as well. States he was told years ago that he has fatty liver. He is heaviest he has ever been. He has gained 40 pounds since 2017. Weight up 30 pounds since 04/2017. He has chronic back issues and neuropathy of lower extremities. Limited physical activity.    Patient reports daily BMs. No melena, brbpr. Reflux is well controlled. No dysphagia. Bending over to tie shoes hurts upper abdomen. No dysphagia.   Patient denies past or present heavy etoh use.   08/2018: Hep A IgM, Hep B surf Ag,  Hep B Core IgM, Hep C ab negative. GGT 240H, Tbili 0.2, AP 170H, AST 48H, ALT 74H, alb 4.4, WBC 10,200, H/H 14.4/44.2, Platelets 234,000, Creatinine 0.83,   LFTs normal 03/2018 except AP 152H.   No results found for: HGBA1C  Chest CT recently at [facility] with possible early cirrhosis.   Current Outpatient Medications  Medication Sig Dispense Refill  . allopurinol (ZYLOPRIM) 100 MG tablet Take 100 mg by mouth daily.    Marland Kitchen aspirin 81 MG tablet Take 81 mg by mouth daily.    Marland Kitchen atorvastatin (LIPITOR) 40 MG tablet Take 1 tablet by mouth at bedtime.  11  . hydrOXYzine (ATARAX/VISTARIL) 50 MG tablet Take 50 mg by mouth 4 (four) times daily as needed.    Marland Kitchen levothyroxine  (SYNTHROID, LEVOTHROID) 100 MCG tablet Take 100 mcg by mouth daily before breakfast.    . lisinopril (PRINIVIL,ZESTRIL) 20 MG tablet Take 20 mg by mouth daily as needed (blood pressure).   1  . morphine (MS CONTIN) 30 MG 12 hr tablet Take 30 mg by mouth every 12 (twelve) hours.    . pantoprazole (PROTONIX) 40 MG tablet Take 1 tablet by mouth daily.    Marland Kitchen PRESCRIPTION MEDICATION Oxycodone  10 mg  One tablet twice daily as needed    . sertraline (ZOLOFT) 100 MG tablet Take 100 mg by mouth daily. One and one half tablets by mouth daily    . tiZANidine (ZANAFLEX) 4 MG tablet Take 4 mg by mouth every 6 (six) hours as needed for muscle spasms. Taking three times a day as needed     No current facility-administered medications for this visit.     Allergies as of 11/23/2018 - Review Complete 11/23/2018  Allergen Reaction Noted  . Ibuprofen  09/12/2015  . Lyrica [pregabalin]  01/11/2012    Past Medical History:  Diagnosis Date  . Anxiety    and atypical chest pain  . Chronic back pain   . DDD (degenerative disc disease)   . Depression   . GERD (gastroesophageal reflux disease)   . Hemorrhoids   . Hypercholesteremia   . Labile hypertension   . Multilevel degenerative disc disease   . Sleep apnea  does not wear CPAP    Past Surgical History:  Procedure Laterality Date  . BIOPSY  10/07/2015   Procedure: BIOPSY;  Surgeon: Daneil Dolin, MD;  Location: [facility];  Service: Endoscopy;;  esophageal bx;   Marland Kitchen COLONOSCOPY WITH PROPOFOL N/A 10/07/2015   Dr.Rourk- diverticulosis in the entire examined colon, internal hemorrhoids, the examination was o/w normal  . ESOPHAGOGASTRODUODENOSCOPY (EGD) WITH PROPOFOL N/A 10/07/2015   Dr.Rourk- esophageal mucosa changes, dilated, small hiatal hernia, the examination was o/w normal bx= benign squamous mucosa with minimal reactive changes  . HEMORRHOID BANDING  10/02/2015   Dr.Rourk  . INGUINAL HERNIA REPAIR Left 04/21/2017   Procedure: LEFT INGUINAL  HERNIORRHAPHY WITH MESH;  Surgeon: Aviva Signs, MD;  Location: [facility] ORS;  Service: General;  Laterality: Left;  . KNEE ARTHROSCOPY Left   . MALONEY DILATION N/A 10/07/2015   Procedure: Venia Minks DILATION;  Surgeon: Daneil Dolin, MD;  Location: [facility];  Service: Endoscopy;  Laterality: N/A;  . MANDIBLE FRACTURE SURGERY     wired shut after a fall  . neck fusion      Family History  Problem Relation Age of Onset  . [name] Mother   . Hypertension Mother   . Colon cancer Mother 79       Passed away 10/02/10 from stage IV CRC  . Lung cancer Father   . Liver disease Neg Hx   . Celiac disease Neg Hx     Social History   Socioeconomic History  . Marital status: Divorced    Spouse name: Not on file  . Number of children: Not on file  . Years of education: Not on file  . Highest education level: Not on file  Occupational History  . Not on file  Social Needs  . Financial resource strain: Not on file  . Food insecurity:    Worry: Not on file    Inability: Not on file  . Transportation needs:    Medical: Not on file    Non-medical: Not on file  Tobacco Use  . Smoking status: Never Smoker  . Smokeless tobacco: Never Used  . Tobacco comment: Never smoked  Substance and Sexual Activity  . Alcohol use: No    Alcohol/week: 0.0 standard drinks    Comment: no prior heavy use  . Drug use: No  . Sexual activity: Never    Birth control/protection: None  Lifestyle  . Physical activity:    Days per week: Not on file    Minutes per session: Not on file  . Stress: Not on file  Relationships  . Social connections:    Talks on phone: Not on file    Gets together: Not on file    Attends religious service: Not on file    Active member of club or organization: Not on file    Attends meetings of clubs or organizations: Not on file    Relationship status: Not on file  . Intimate partner violence:    Fear of current or ex partner: Not on file    Emotionally abused: Not on file     Physically abused: Not on file    Forced sexual activity: Not on file  Other Topics Concern  . Not on file  Social History Narrative  . Not on file      ROS:  General: Negative for anorexia, weight loss, fever, chills, fatigue, weakness. Eyes: Negative for vision changes.  ENT: Negative for hoarseness, difficulty swallowing , nasal congestion. CV:  Negative for chest pain, angina, palpitations, dyspnea on exertion, peripheral edema.  Respiratory: Negative for dyspnea at rest, dyspnea on exertion, cough, sputum, wheezing.  GI: See history of present illness. GU:  Negative for dysuria, hematuria, urinary incontinence, urinary frequency, nocturnal urination.  MS: chronic back pain.   Derm: Negative for rash or itching.  Neuro: Negative for weakness, abnormal sensation, seizure, frequent headaches, memory loss, confusion.  Psych: Negative for anxiety, depression, suicidal ideation, hallucinations.  Endo: Negative for unusual weight change.  Heme: Negative for bruising or bleeding. Allergy: Negative for rash or hives.    Physical Examination:  BP (!) 141/80   Pulse 86   Temp (!) 97.4 F (36.3 C) (Oral)   Ht 6\' 2"  (1.88 m)   Wt 296 lb (134.3 kg)   BMI 38.00 kg/m    General: Well-nourished, well-developed in no acute distress.  Head: Normocephalic, atraumatic.   Eyes: Conjunctiva pink, no icterus. Mouth: Oropharyngeal mucosa moist and pink , no lesions erythema or exudate. Neck: Supple without thyromegaly, masses, or lymphadenopathy.  Lungs: Clear to auscultation bilaterally.  Heart: Regular rate and rhythm, no murmurs rubs or gallops.  Abdomen: Bowel sounds are normal, nontender, nondistended, no hepatosplenomegaly or masses, no abdominal bruits or    hernia , no rebound or guarding.   Rectal: not performed Extremities: No lower extremity edema. No clubbing or deformities.  Neuro: Alert and oriented x 4 , grossly normal neurologically.  Skin: Warm and dry, no rash or  jaundice.   Psych: Alert and cooperative, normal mood and affect.  Labs: See above.  Imaging Studies: No results found.

## 2018-11-24 NOTE — Progress Notes (Signed)
cc'ed to pcp °

## 2018-11-30 ENCOUNTER — Other Ambulatory Visit: Payer: Self-pay

## 2018-11-30 ENCOUNTER — Ambulatory Visit (HOSPITAL_COMMUNITY)
Admission: RE | Admit: 2018-11-30 | Discharge: 2018-11-30 | Disposition: A | Discharge status: Home / Self Care | Payer: Medicare HMO | Source: Ambulatory Visit | Attending: Gastroenterology | Admitting: Gastroenterology

## 2018-11-30 ENCOUNTER — Other Ambulatory Visit (HOSPITAL_COMMUNITY)
Admission: RE | Admit: 2018-11-30 | Discharge: 2018-11-30 | Disposition: A | Discharge status: Home / Self Care | Payer: Medicare HMO | Source: Ambulatory Visit | Attending: Gastroenterology | Admitting: Gastroenterology

## 2018-11-30 DIAGNOSIS — R7989 Other specified abnormal findings of blood chemistry: Secondary | ICD-10-CM

## 2018-11-30 DIAGNOSIS — N200 Calculus of kidney: Secondary | ICD-10-CM | POA: Diagnosis not present

## 2018-11-30 DIAGNOSIS — R945 Abnormal results of liver function studies: Secondary | ICD-10-CM | POA: Diagnosis present

## 2018-11-30 LAB — CBC WITH DIFFERENTIAL/PLATELET
Abs Immature Granulocytes: 0.05 10*3/uL (ref 0.00–0.07)
Basophils Absolute: 0.1 10*3/uL (ref 0.0–0.1)
Basophils Relative: 1 %
Eosinophils Absolute: 0.4 10*3/uL (ref 0.0–0.5)
Eosinophils Relative: 4 %
HCT: 44.4 % (ref 39.0–52.0)
Hemoglobin: 14.2 g/dL (ref 13.0–17.0)
Immature Granulocytes: 1 %
Lymphocytes Relative: 19 %
Lymphs Abs: 2 10*3/uL (ref 0.7–4.0)
MCH: 27.7 pg (ref 26.0–34.0)
MCHC: 32 g/dL (ref 30.0–36.0)
MCV: 86.5 fL (ref 80.0–100.0)
Monocytes Absolute: 0.7 10*3/uL (ref 0.1–1.0)
Monocytes Relative: 7 %
Neutro Abs: 7.1 10*3/uL (ref 1.7–7.7)
Neutrophils Relative %: 68 %
Platelets: 220 10*3/uL (ref 150–400)
RBC: 5.13 MIL/uL (ref 4.22–5.81)
RDW: 13.7 % (ref 11.5–15.5)
WBC: 10.4 10*3/uL (ref 4.0–10.5)
nRBC: 0 % (ref 0.0–0.2)

## 2018-11-30 LAB — PROTIME-INR
INR: 1 (ref 0.8–1.2)
Prothrombin Time: 12.7 seconds (ref 11.4–15.2)

## 2018-11-30 LAB — COMPREHENSIVE METABOLIC PANEL
ALT: 35 U/L (ref 0–44)
AST: 27 U/L (ref 15–41)
Albumin: 4.1 g/dL (ref 3.5–5.0)
Alkaline Phosphatase: 122 U/L (ref 38–126)
Anion gap: 13 (ref 5–15)
BUN: 13 mg/dL (ref 6–20)
CO2: 24 mmol/L (ref 22–32)
Calcium: 9.5 mg/dL (ref 8.9–10.3)
Chloride: 103 mmol/L (ref 98–111)
Creatinine, Ser: 0.91 mg/dL (ref 0.61–1.24)
GFR calc Af Amer: >60 mL/min (ref >60)
GFR calc non Af Amer: >60 mL/min (ref >60)
Glucose, Bld: 119 mg/dL — ABNORMAL HIGH (ref 70–99)
Potassium: 4.2 mmol/L (ref 3.5–5.1)
Sodium: 140 mmol/L (ref 135–145)
Total Bilirubin: 0.4 mg/dL (ref 0.3–1.2)
Total Protein: 7.6 g/dL (ref 6.5–8.1)

## 2018-11-30 LAB — IRON AND TIBC
Iron: 54 ug/dL (ref 45–182)
Saturation Ratios: 15 % — ABNORMAL LOW (ref 17.9–39.5)
TIBC: 353 ug/dL (ref 250–450)
UIBC: 299 ug/dL

## 2018-11-30 LAB — FERRITIN: Ferritin: 122 ng/mL (ref 24–336)

## 2018-12-01 LAB — IGG, IGA, IGM
IgA: 179 mg/dL (ref 90–386)
IgG (Immunoglobin G), Serum: 955 mg/dL (ref 603–1613)
IgM (Immunoglobulin M), Srm: 90 mg/dL (ref 20–172)

## 2018-12-01 LAB — ANTI-SMOOTH MUSCLE ANTIBODY, IGG: F-Actin IgG: 5 Units (ref 0–19)

## 2018-12-01 LAB — HEPATITIS B SURFACE ANTIBODY, QUANTITATIVE: Hep B S AB Quant (Post): <3.1 m[IU]/mL — ABNORMAL LOW (ref >9.9)

## 2018-12-01 LAB — ANA: Anti Nuclear Antibody (ANA): NEGATIVE

## 2018-12-01 LAB — HEPATITIS B CORE ANTIBODY, TOTAL: Hep B Core Total Ab: NEGATIVE

## 2018-12-01 LAB — MITOCHONDRIAL ANTIBODIES: Mitochondrial M2 Ab, IgG: <20 Units (ref 0.0–20.0)

## 2018-12-01 LAB — HEPATITIS A ANTIBODY, TOTAL: hep A Total Ab: POSITIVE — AB

## 2018-12-02 ENCOUNTER — Telehealth: Payer: Self-pay | Admitting: Internal Medicine

## 2018-12-02 LAB — TISSUE TRANSGLUTAMINASE, IGA: Tissue Transglutaminase Ab, IgA: <2 U/mL (ref 0–3)

## 2018-12-02 NOTE — Telephone Encounter (Signed)
Pt called asking if his results of his U/S and labs were available. [phone]

## 2018-12-05 NOTE — Telephone Encounter (Signed)
Pt called again after hours and LMOM regarding his results. I transferred call the AM VM

## 2018-12-05 NOTE — Telephone Encounter (Signed)
SEE RESULT NOTE FOR U/S AND LABS

## 2018-12-05 NOTE — Telephone Encounter (Signed)
VM received, pt is inquiring of his u/s results. Pt stated that the nurse made some comments while his u/s was being performed and he got nervous.

## 2018-12-06 NOTE — Telephone Encounter (Signed)
Results given to pt

## 2018-12-07 ENCOUNTER — Other Ambulatory Visit: Payer: Self-pay

## 2018-12-07 DIAGNOSIS — R945 Abnormal results of liver function studies: Secondary | ICD-10-CM

## 2018-12-07 DIAGNOSIS — R7989 Other specified abnormal findings of blood chemistry: Secondary | ICD-10-CM

## 2018-12-08 ENCOUNTER — Encounter: Payer: Self-pay | Admitting: Internal Medicine

## 2018-12-08 NOTE — Progress Notes (Signed)
CC'D TO PCP, ON RECALL AND APPOINTMENT MADE

## 2018-12-12 ENCOUNTER — Telehealth: Payer: Self-pay | Admitting: Internal Medicine

## 2018-12-12 NOTE — Telephone Encounter (Signed)
Pt called office, he will be leaving home to pick up his daughter this afternoon. Call mobile number if he's unable to be reached at home. [phone]

## 2018-12-12 NOTE — Telephone Encounter (Signed)
Pt was calling to speak with [facility] nurse. [phone]

## 2018-12-12 NOTE — Telephone Encounter (Signed)
Spoke with pt. He wanted to discuss his results which were given last week. Manuela Schwartz, can you send a copy of pts labs and imaging results to pts PCP.

## 2018-12-13 NOTE — Telephone Encounter (Signed)
Labs and imaging were cc'ed to Dr Rayna Sexton office

## 2018-12-15 NOTE — Progress Notes (Signed)
Forwarding to Manuela Schwartz to send copy of [place] to PCP.

## 2018-12-20 ENCOUNTER — Other Ambulatory Visit: Payer: Self-pay

## 2018-12-20 DIAGNOSIS — R1011 Right upper quadrant pain: Secondary | ICD-10-CM

## 2018-12-20 NOTE — Progress Notes (Unsigned)
n

## 2018-12-27 ENCOUNTER — Encounter (HOSPITAL_COMMUNITY)
Admission: RE | Admit: 2018-12-27 | Discharge: 2018-12-27 | Disposition: A | Discharge status: Home / Self Care | Payer: Medicare HMO | Source: Ambulatory Visit | Attending: Gastroenterology | Admitting: Gastroenterology

## 2018-12-27 ENCOUNTER — Other Ambulatory Visit: Payer: Self-pay

## 2018-12-27 ENCOUNTER — Encounter (HOSPITAL_COMMUNITY): Payer: Self-pay

## 2018-12-27 DIAGNOSIS — R1011 Right upper quadrant pain: Secondary | ICD-10-CM | POA: Diagnosis not present

## 2018-12-27 MED ORDER — TECHNETIUM TC 99M MEBROFENIN IV KIT
5.0000 | PACK | Freq: Once | INTRAVENOUS | Status: AC | PRN
  Administered 2018-12-27: 4.7 via INTRAVENOUS

## 2018-12-29 ENCOUNTER — Telehealth: Payer: Self-pay | Admitting: Internal Medicine

## 2018-12-29 NOTE — Telephone Encounter (Signed)
Patient called inquiring about his test results from 2 days ago, I explained it can take up to 10 working days but that we would let him know as soon as the results were released.

## 2019-01-02 NOTE — Telephone Encounter (Signed)
Pt notified of results

## 2019-03-13 ENCOUNTER — Telehealth: Payer: Self-pay

## 2019-03-13 ENCOUNTER — Ambulatory Visit: Payer: Medicare HMO | Admitting: Gastroenterology

## 2019-03-13 NOTE — Telephone Encounter (Signed)
Noted  

## 2019-03-13 NOTE — Telephone Encounter (Signed)
Pt called and said he wasn't able to make his apt this morning @ 11:30  due to the back pain he was having. Pt scheduled another apt for 03/29/2019 @ 11:30.

## 2019-03-28 NOTE — Progress Notes (Signed)
Primary Care Physician: Deloria Lair., MD  Primary Gastroenterologist:  Garfield Cornea, MD   Chief Complaint  Patient presents with   abnormal LFTs    HPI: Craig Drake is a 53 y.o. male here for follow up. Last seen in 11/2018. H/o abnormal LFTs, erosive reflux esophagitis, peptic stricture. Last EGD 2017 with esophageal mucosal changes concerning for eosinophilic esophagitis but bx not c/w, esophagus dilated. Colonoscopy 2017 with internal hemorrhoids, diverticulosis. Next TCS in 2022 due to family history of CRC, mother. He has had hemorrhoid banding with Dr. Gala Romney in 2017.  H/o fatty liver. Gained 55 pounds since 2017.  Has gained an additional 15 pounds since we last saw him.  No past or present etoh use.   08/2018: Hep A IgM, Hep B surf Ag,  Hep B Core IgM, Hep C ab negative. GGT 240H, Tbili 0.2, AP 170H, AST 48H, ALT 74H, alb 4.4, WBC 10,200, H/H 14.4/44.2, Platelets 234,000, Creatinine 0.83  Labs and u/s in 11/2018: celiac screen negative. Autoimmune liver markers negative, AMA negative. He is immune to Hep A. U/S with fatty liver, kidneys both large with abundant fat. Advised to see PCP for kidney findings. Prior chest CT at [facility] with upper liver border slightly nodular. Advised repeat labs and u/s with elastography in six months. Advised for hep b vaccinations.  HIDA was unremarkable.  Very interested in losing weight.  Does not want to pursue gastric bypass or other gastric surgeries for weight loss.  Chronic back and joint pain limits mobility.  Requesting letter to assist with getting insurance to cover for dental implants with bridge for swallowing issues.  No swallowing difficulties with liquids or pills.  Solid food dysphagia improved after dilatation in 2017 but requires being careful with bite size, chewing food thoroughly which is inhibited by his poor dentition.  Bowel movements regular.  No blood in the stool or melena.  No issues with reflux.  He has completed 2  of 3 hepatitis B vaccine shots.  Denies any abdominal pain.    Current Outpatient Medications  Medication Sig Dispense Refill   allopurinol (ZYLOPRIM) 100 MG tablet Take 100 mg by mouth daily.     aspirin 81 MG tablet Take 81 mg by mouth daily.     atorvastatin (LIPITOR) 40 MG tablet Take 1 tablet by mouth at bedtime.  11   hydrOXYzine (ATARAX/VISTARIL) 50 MG tablet Take 50 mg by mouth 4 (four) times daily as needed.     levothyroxine (SYNTHROID, LEVOTHROID) 100 MCG tablet Take 100 mcg by mouth daily before breakfast.     lisinopril (PRINIVIL,ZESTRIL) 20 MG tablet Take 20 mg by mouth daily as needed (blood pressure).   1   morphine (MS CONTIN) 30 MG 12 hr tablet Take 30 mg by mouth every 12 (twelve) hours.     pantoprazole (PROTONIX) 40 MG tablet Take 1 tablet by mouth daily.     tiZANidine (ZANAFLEX) 4 MG tablet Take 4 mg by mouth every 6 (six) hours as needed for muscle spasms. Taking three times a day as needed     PRESCRIPTION MEDICATION Oxycodone  10 mg  One tablet twice daily as needed     sertraline (ZOLOFT) 100 MG tablet Take 150 mg by mouth daily. One and one half tablets by mouth daily      No current facility-administered medications for this visit.     Allergies as of 03/29/2019 - Review Complete 03/29/2019  Allergen Reaction Noted  Ibuprofen  09/12/2015   Lyrica [pregabalin]  01/11/2012    ROS:  General: Negative for anorexia, weight loss, fever, chills, fatigue, weakness. ENT: Negative for hoarseness,nasal congestion.  See HPI CV: Negative for chest pain, angina, palpitations, dyspnea on exertion, peripheral edema.  Respiratory: Negative for dyspnea at rest, dyspnea on exertion, cough, sputum, wheezing.  GI: See history of present illness. GU:  Negative for dysuria, hematuria, urinary incontinence, urinary frequency, nocturnal urination.  Endo: Negative for unusual weight change.    Physical Examination:   BP 118/77    Pulse 82    Temp (!) 97.3 F  (36.3 C) (Temporal)    Ht 6\' 2"  (1.88 m)    Wt (!) 309 lb 6.4 oz (140.3 kg)    BMI 39.72 kg/m   General: Well-nourished, well-developed in no acute distress.  Eyes: No icterus. Mouth: Oropharyngeal mucosa moist and pink , no lesions erythema or exudate.  Dentition in poor repair, multiple missing teeth. Lungs: Clear to auscultation bilaterally.  Heart: Regular rate and rhythm, no murmurs rubs or gallops.  Abdomen: Bowel sounds are normal, nontender, nondistended, no hepatosplenomegaly or masses, no abdominal bruits or hernia, no rebound or guarding.   Extremities: no lower extremity edema. No clubbing or deformities. Neuro: Alert and oriented x 4   Skin: Warm and dry, no jaundice.   Psych: Alert and cooperative, normal mood and affect.  Labs:  Lab Results  Component Value Date   CREATININE 0.91 11/30/2018   BUN 13 11/30/2018   NA 140 11/30/2018   K 4.2 11/30/2018   CL 103 11/30/2018   CO2 24 11/30/2018   Lab Results  Component Value Date   ALT 35 11/30/2018   AST 27 11/30/2018   ALKPHOS 122 11/30/2018   BILITOT 0.4 11/30/2018   Lab Results  Component Value Date   WBC 10.4 11/30/2018   HGB 14.2 11/30/2018   HCT 44.4 11/30/2018   MCV 86.5 11/30/2018   PLT 220 11/30/2018   Lab Results  Component Value Date   INR 1.0 11/30/2018    Imaging Studies: No results found.

## 2019-03-29 ENCOUNTER — Encounter: Payer: Self-pay | Admitting: Gastroenterology

## 2019-03-29 ENCOUNTER — Ambulatory Visit (INDEPENDENT_AMBULATORY_CARE_PROVIDER_SITE_OTHER): Payer: Medicare HMO | Admitting: Gastroenterology

## 2019-03-29 ENCOUNTER — Other Ambulatory Visit: Payer: Self-pay

## 2019-03-29 VITALS — BP 118/77 | HR 82 | Temp 97.3°F | Ht 74.0 in | Wt 309.4 lb

## 2019-03-29 DIAGNOSIS — R1314 Dysphagia, pharyngoesophageal phase: Secondary | ICD-10-CM

## 2019-03-29 DIAGNOSIS — K219 Gastro-esophageal reflux disease without esophagitis: Secondary | ICD-10-CM | POA: Diagnosis not present

## 2019-03-29 DIAGNOSIS — R945 Abnormal results of liver function studies: Secondary | ICD-10-CM | POA: Diagnosis not present

## 2019-03-29 DIAGNOSIS — R7989 Other specified abnormal findings of blood chemistry: Secondary | ICD-10-CM

## 2019-03-29 DIAGNOSIS — K760 Fatty (change of) liver, not elsewhere classified: Secondary | ICD-10-CM | POA: Diagnosis not present

## 2019-03-29 NOTE — Progress Notes (Signed)
Please fax copy of letter to [phone] ([place])

## 2019-03-29 NOTE — Patient Instructions (Signed)
1. We will contact you to schedule your next ultrasound and labs in 05/2019. 2. I will submit a letter to your dentist regarding need for implant/bridge due to swallowing.  3. We will be working on referral to a bariatric (weight loss) clinic.  4. Return to our office in six months.

## 2019-03-29 NOTE — Assessment & Plan Note (Signed)
Typical symptoms well controlled on pantoprazole 40 mg daily.

## 2019-03-29 NOTE — Assessment & Plan Note (Signed)
Elevated LFTs, fatty liver on imaging with questionable early cirrhosis noted on chest CT at [facility].  Patient is at risk for [facility].  He is currently completing hepatitis B vaccinations.  Plans to update labs and pursue ultrasound with elastography in December 2020.  Encouraged weight loss.  Patient has gained 15 pounds since his last visit.  His endocrinologist has recommended gastric bypass but patient declines.  Will make referral to bariatric clinic for nonsurgical options.  Return to the office in 6 months.  Will obtain most recent labs from PCP for review.

## 2019-03-29 NOTE — Assessment & Plan Note (Signed)
Patient with history of esophageal dysphagia status post esophageal dilation back in 2017.  Endoscopically findings most consistent with eosinophilic esophagitis but biopsies were not supportive of diagnosis.  Patient has poor dentition. Having difficulty chewing solid foods thoroughly. Desires dental implants with bridge. More effective mastication would be helpful with his swallowing. Will provide supporting letter.

## 2019-03-30 ENCOUNTER — Other Ambulatory Visit: Payer: Self-pay

## 2019-03-30 NOTE — Progress Notes (Signed)
Opened in error

## 2019-04-20 ENCOUNTER — Telehealth: Payer: Self-pay | Admitting: *Deleted

## 2019-04-20 NOTE — Telephone Encounter (Signed)
Patient declined appointment at [place] bariatric clinic.

## 2019-04-25 ENCOUNTER — Telehealth: Payer: Self-pay | Admitting: Gastroenterology

## 2019-04-25 NOTE — Telephone Encounter (Signed)
Labs dated September 2020: Albumin 4, total bilirubin 0.2, alkaline phosphatase 143, AST 21, ALT 30, A1c 6.2, free T4 1.42, TSH 3.870.

## 2019-04-25 NOTE — Telephone Encounter (Signed)
Labs dated September 2020: Albumin 4, total bilirubin 0.2, alkaline phosphatase 143, AST 21, ALT 30, A1c 6.2, free T4 1.42, TSH 3.870

## 2019-05-08 ENCOUNTER — Telehealth: Payer: Self-pay | Admitting: Internal Medicine

## 2019-05-08 NOTE — Telephone Encounter (Signed)
RECALL FOR ULTRASOUND 

## 2019-05-08 NOTE — Telephone Encounter (Signed)
Recall mailed 

## 2019-05-17 ENCOUNTER — Other Ambulatory Visit: Payer: Self-pay

## 2019-05-17 DIAGNOSIS — R945 Abnormal results of liver function studies: Secondary | ICD-10-CM

## 2019-05-17 DIAGNOSIS — R7989 Other specified abnormal findings of blood chemistry: Secondary | ICD-10-CM

## 2019-05-29 ENCOUNTER — Other Ambulatory Visit: Payer: Self-pay | Admitting: Nurse Practitioner

## 2019-05-29 DIAGNOSIS — M5136 Other intervertebral disc degeneration, lumbar region: Secondary | ICD-10-CM

## 2019-05-31 ENCOUNTER — Telehealth: Payer: Self-pay | Admitting: Internal Medicine

## 2019-05-31 DIAGNOSIS — K760 Fatty (change of) liver, not elsewhere classified: Secondary | ICD-10-CM

## 2019-05-31 DIAGNOSIS — R7989 Other specified abnormal findings of blood chemistry: Secondary | ICD-10-CM

## 2019-05-31 DIAGNOSIS — R945 Abnormal results of liver function studies: Secondary | ICD-10-CM

## 2019-05-31 NOTE — Telephone Encounter (Addendum)
RUQ U/S WITH ELASTOGRAPHY IN 6 MONTHS (DX: ABNORMAL LFTS, FATTY LIVER)   They may not do elastography at [facility].

## 2019-05-31 NOTE — Telephone Encounter (Signed)
Patient called and is requesting to have his ultrasound done at [facility]. LSL please advise if patient Craig Drake is ruq or complete with elastography? Thanks

## 2019-05-31 NOTE — Telephone Encounter (Signed)
Checked evicore for PA for [place]:  NOTE: This [facility] member does not require prior authorization for OUTPATIENT Radiology through [place] or [place] [facility] at this time

## 2019-05-31 NOTE — Telephone Encounter (Signed)
[phone] or [phone]  Patient called stating he was due for his ultrasound and he mentioned if he could have it at [facility]

## 2019-05-31 NOTE — Addendum Note (Signed)
Addended by: Cheron Every on: 05/31/2019 01:42 PM   Modules accepted: Orders

## 2019-05-31 NOTE — Telephone Encounter (Addendum)
Called [facility] and spoke with [place] tech. They do not do elastography. Called patient and made aware will need to go to [facility] for [place]. He voiced understanding.  [place] scheduled for 12/14 at 8:00am, arrival 7:45am, npo midnight.  Patient aware of appt details.

## 2019-06-05 ENCOUNTER — Ambulatory Visit (HOSPITAL_COMMUNITY): Admission: RE | Admit: 2019-06-05 | Payer: Medicare HMO | Source: Ambulatory Visit

## 2019-06-13 ENCOUNTER — Ambulatory Visit (HOSPITAL_COMMUNITY): Payer: Medicare HMO

## 2019-06-14 ENCOUNTER — Other Ambulatory Visit (HOSPITAL_COMMUNITY)
Admission: RE | Admit: 2019-06-14 | Discharge: 2019-06-14 | Disposition: A | Discharge status: Home / Self Care | Payer: Medicare HMO | Source: Ambulatory Visit | Attending: Gastroenterology | Admitting: Gastroenterology

## 2019-06-14 ENCOUNTER — Other Ambulatory Visit: Payer: Self-pay

## 2019-06-14 ENCOUNTER — Ambulatory Visit (HOSPITAL_COMMUNITY)
Admission: RE | Admit: 2019-06-14 | Discharge: 2019-06-14 | Disposition: A | Discharge status: Home / Self Care | Payer: Medicare HMO | Source: Ambulatory Visit | Attending: Gastroenterology | Admitting: Gastroenterology

## 2019-06-14 ENCOUNTER — Other Ambulatory Visit (HOSPITAL_COMMUNITY): Payer: Self-pay | Admitting: Nurse Practitioner

## 2019-06-14 DIAGNOSIS — R945 Abnormal results of liver function studies: Secondary | ICD-10-CM | POA: Diagnosis not present

## 2019-06-14 DIAGNOSIS — M5136 Other intervertebral disc degeneration, lumbar region: Secondary | ICD-10-CM

## 2019-06-14 DIAGNOSIS — R7989 Other specified abnormal findings of blood chemistry: Secondary | ICD-10-CM

## 2019-06-14 DIAGNOSIS — K760 Fatty (change of) liver, not elsewhere classified: Secondary | ICD-10-CM

## 2019-06-14 LAB — COMPREHENSIVE METABOLIC PANEL
ALT: 33 U/L (ref 0–44)
AST: 31 U/L (ref 15–41)
Albumin: 4.3 g/dL (ref 3.5–5.0)
Alkaline Phosphatase: 91 U/L (ref 38–126)
Anion gap: 12 (ref 5–15)
BUN: 11 mg/dL (ref 6–20)
CO2: 26 mmol/L (ref 22–32)
Calcium: 9.8 mg/dL (ref 8.9–10.3)
Chloride: 99 mmol/L (ref 98–111)
Creatinine, Ser: 1 mg/dL (ref 0.61–1.24)
GFR calc Af Amer: >60 mL/min (ref >60)
GFR calc non Af Amer: >60 mL/min (ref >60)
Glucose, Bld: 116 mg/dL — ABNORMAL HIGH (ref 70–99)
Potassium: 4.4 mmol/L (ref 3.5–5.1)
Sodium: 137 mmol/L (ref 135–145)
Total Bilirubin: 0.4 mg/dL (ref 0.3–1.2)
Total Protein: 8 g/dL (ref 6.5–8.1)

## 2019-06-14 LAB — PROTIME-INR
INR: 0.9 (ref 0.8–1.2)
Prothrombin Time: 12.2 seconds (ref 11.4–15.2)

## 2019-06-21 ENCOUNTER — Other Ambulatory Visit: Payer: Self-pay

## 2019-06-21 ENCOUNTER — Ambulatory Visit (HOSPITAL_COMMUNITY)
Admission: RE | Admit: 2019-06-21 | Discharge: 2019-06-21 | Disposition: A | Discharge status: Home / Self Care | Payer: Medicare HMO | Source: Ambulatory Visit | Attending: Nurse Practitioner | Admitting: Nurse Practitioner

## 2019-06-21 DIAGNOSIS — M5136 Other intervertebral disc degeneration, lumbar region: Secondary | ICD-10-CM | POA: Insufficient documentation

## 2019-06-21 NOTE — Progress Notes (Signed)
Pt informed of results.  Advised pt to keep his upcoming ov this spring.  He was also informed that we will keep a check on his labs and consider updating u/s of liver in 12-18 months.  He is aware that we will discuss further at his ov in the spring.  Pt voiced understanding.

## 2019-08-29 ENCOUNTER — Other Ambulatory Visit: Payer: Self-pay | Admitting: Gastroenterology

## 2019-08-29 ENCOUNTER — Encounter: Payer: Self-pay | Admitting: *Deleted

## 2019-08-29 ENCOUNTER — Ambulatory Visit (INDEPENDENT_AMBULATORY_CARE_PROVIDER_SITE_OTHER): Payer: Medicare HMO | Admitting: Gastroenterology

## 2019-08-29 ENCOUNTER — Encounter: Payer: Self-pay | Admitting: Gastroenterology

## 2019-08-29 ENCOUNTER — Other Ambulatory Visit: Payer: Self-pay

## 2019-08-29 VITALS — BP 126/83 | HR 89 | Temp 97.8°F | Ht 74.0 in | Wt 293.2 lb

## 2019-08-29 DIAGNOSIS — K760 Fatty (change of) liver, not elsewhere classified: Secondary | ICD-10-CM

## 2019-08-29 DIAGNOSIS — K219 Gastro-esophageal reflux disease without esophagitis: Secondary | ICD-10-CM

## 2019-08-29 DIAGNOSIS — R945 Abnormal results of liver function studies: Secondary | ICD-10-CM | POA: Diagnosis not present

## 2019-08-29 DIAGNOSIS — K625 Hemorrhage of anus and rectum: Secondary | ICD-10-CM

## 2019-08-29 DIAGNOSIS — Z86010 Personal history of colonic polyps: Secondary | ICD-10-CM | POA: Insufficient documentation

## 2019-08-29 DIAGNOSIS — R7989 Other specified abnormal findings of blood chemistry: Secondary | ICD-10-CM

## 2019-08-29 MED ORDER — CLENPIQ 10-3.5-12 MG-GM -GM/160ML PO SOLN: 1.0000 | Freq: Once | ORAL | 0 refills | Status: AC

## 2019-08-29 NOTE — H&P (View-Only) (Signed)
Primary Care Physician: Deloria Lair., MD  Primary Gastroenterologist:  Garfield Cornea, MD   Chief Complaint  Patient presents with  . Rectal Bleeding    hx of rectal bleeding,hasn't seen any in 3 days    HPI: Craig Drake is a 54 y.o. male here for follow-up of blood in the stool, dysphagia, GERD.    He was last seen in October 2020.  He has a history of abnormal LFTs, erosive reflux esophagitis with peptic stricture.Last EGD 2017 with esophageal mucosal changes concerning for eosinophilic esophagitis but bx not c/w, esophagus dilated. Colonoscopy 2017 with internal hemorrhoids, diverticulosis. Next TCS in 2022 due to family history of CRC, mother. He has had hemorrhoid banding with Dr. Gala Romney in 2017.  History of fatty liver likely due to [place].  No past or present alcohol use.  Ultrasound also showed kidneys both enlarged with abundant fat.  Prior chest CT at [facility] with upper liver border slightly nodular.  Ultrasound with elastography in December 2020 showed diffuse hepatic steatosis but no overt changes of cirrhosis. His kPA score was good and in the setting of nonalcoholic fatty liver disease would indicate no to very little fibrosis.  Plans to consider ultrasound of the liver in 12 to 18 months.  08/2018: Hep A IgM, Hep B surf Ag, Hep B Core IgM, Hep C ab negative. GGT 240H, Tbili 0.2, AP 170H, AST 48H, ALT 74H, alb 4.4, WBC 10,200, H/H 14.4/44.2, Platelets 234,000,Creatinine 0.83. Labs and u/s in 11/2018: celiac screen negative. Autoimmune liver markers negative, AMA negative. He is immune to Hep A. Completed Hep B vaccinations.    Wt Readings from Last 3 Encounters:  08/29/19 293 lb 3.2 oz (133 kg)  03/29/19 (!) 309 lb 6.4 oz (140.3 kg)  11/23/18 296 lb (134.3 kg)   Today he presents with complaints of Seven days of moderate volume brbpr.  Noted it predominantly on the toilet tissue but seem to be quite a bit of blood.  Applying pressure to the anal area actually made  it bleed more.  Thinks it might be a hemorrhoids.  Last episode of bleeding 3 days ago.  He had two hemorrhoid banding sessions in 2017, right anterior hemorrhoid and left lateral internal hemorrhoid.  He did not follow-up for his third banding session. BM regular. No hard stool. No abdominal pain. No heartburn or dysphagia. First covid vaccine done.   Patient has been going to [facility] for weight loss purposes.  States he was at 315 pounds when he started, got down to 283 pounds but has gained 10 pounds back.  Continues to go to therapy for exercise.  Current Outpatient Medications  Medication Sig Dispense Refill  . allopurinol (ZYLOPRIM) 100 MG tablet Take 100 mg by mouth daily.    Marland Kitchen aspirin 81 MG tablet Take 81 mg by mouth daily.    Marland Kitchen atorvastatin (LIPITOR) 40 MG tablet Take 1 tablet by mouth at bedtime.  11  . levothyroxine (SYNTHROID, LEVOTHROID) 100 MCG tablet Take 100 mcg by mouth daily before breakfast.    . lisinopril (PRINIVIL,ZESTRIL) 20 MG tablet Take 20 mg by mouth daily as needed (blood pressure).   1  . morphine (MS CONTIN) 30 MG 12 hr tablet Take 15 mg by mouth in the morning and at bedtime.     . pantoprazole (PROTONIX) 40 MG tablet Take 1 tablet by mouth daily.    Marland Kitchen PRESCRIPTION MEDICATION Oxycodone  10 mg  One tablet twice  daily as needed    . tiZANidine (ZANAFLEX) 4 MG tablet Take 4 mg by mouth every 6 (six) hours as needed for muscle spasms. Taking three times a day as needed     No current facility-administered medications for this visit.    Allergies as of 08/29/2019 - Review Complete 08/29/2019  Allergen Reaction Noted  . Ibuprofen  09/12/2015  . Lyrica [pregabalin]  01/11/2012   Past Medical History:  Diagnosis Date  . Anxiety    and atypical chest pain  . Chronic back pain   . DDD (degenerative disc disease)   . Depression   . GERD (gastroesophageal reflux disease)   . Hemorrhoids   . Hypercholesteremia   . Labile hypertension   . Multilevel  degenerative disc disease   . Sleep apnea    does not wear CPAP   Past Surgical History:  Procedure Laterality Date  . BIOPSY  10/07/2015   Procedure: BIOPSY;  Surgeon: Daneil Dolin, MD;  Location: [facility];  Service: Endoscopy;;  esophageal bx;   Marland Kitchen COLONOSCOPY WITH PROPOFOL N/A 10/07/2015   Dr.Rourk- diverticulosis in the entire examined colon, internal hemorrhoids, the examination was o/w normal  . ESOPHAGOGASTRODUODENOSCOPY (EGD) WITH PROPOFOL N/A 10/07/2015   Dr.Rourk- esophageal mucosa changes, dilated, small hiatal hernia, the examination was o/w normal bx= benign squamous mucosa with minimal reactive changes  . HEMORRHOID BANDING  24-Sep-2015   Dr.Rourk  . INGUINAL HERNIA REPAIR Left 04/21/2017   Procedure: LEFT INGUINAL HERNIORRHAPHY WITH MESH;  Surgeon: Aviva Signs, MD;  Location: [facility];  Service: General;  Laterality: Left;  . KNEE ARTHROSCOPY Left   . MALONEY DILATION N/A 10/07/2015   Procedure: Venia Minks DILATION;  Surgeon: Daneil Dolin, MD;  Location: [facility];  Service: Endoscopy;  Laterality: N/A;  . MANDIBLE FRACTURE SURGERY     wired shut after a fall  . neck fusion     Family History  Problem Relation Age of Onset  . [name] Mother   . Hypertension Mother   . Colon cancer Mother 100       Passed away 2010/09/24 from stage IV CRC  . Lung cancer Father   . Liver disease Neg Hx   . Celiac disease Neg Hx    Social History   Tobacco Use  . Smoking status: Never Smoker  . Smokeless tobacco: Never Used  . Tobacco comment: Never smoked  Substance Use Topics  . Alcohol use: No    Alcohol/week: 0.0 standard drinks    Comment: no prior heavy use  . Drug use: No    ROS:  General: Negative for anorexia, weight loss, fever, chills, fatigue, weakness. ENT: Negative for hoarseness, difficulty swallowing , nasal congestion. CV: Negative for chest pain, angina, palpitations, dyspnea on exertion, peripheral edema.  Respiratory: Negative for dyspnea at rest, dyspnea on  exertion, cough, sputum, wheezing.  GI: See history of present illness. GU:  Negative for dysuria, hematuria, urinary incontinence, urinary frequency, nocturnal urination.  Endo: Negative for unusual weight change.    Physical Examination:   BP 126/83   Pulse 89   Temp 97.8 F (36.6 C) (Temporal)   Ht 6\' 2"  (1.88 m)   Wt 293 lb 3.2 oz (133 kg)   BMI 37.64 kg/m   General: Well-nourished, well-developed in no acute distress.  Eyes: No icterus. Mouth: masked Lungs: Clear to auscultation bilaterally.  Heart: Regular rate and rhythm, no murmurs rubs or gallops.  Abdomen: Bowel sounds are normal, nontender, nondistended, no hepatosplenomegaly  or masses, no abdominal bruits or hernia , no rebound or guarding.  Body habitus limits exam Extremities: No lower extremity edema. No clubbing or deformities. Neuro: Alert and oriented x 4   Skin: Warm and dry, no jaundice.   Psych: Alert and cooperative, normal mood and affect.  Labs:  Lab Results  Component Value Date   CREATININE 1.00 06/14/2019   BUN 11 06/14/2019   NA 137 06/14/2019   K 4.4 06/14/2019   CL 99 06/14/2019   CO2 26 06/14/2019   Lab Results  Component Value Date   ALT 33 06/14/2019   AST 31 06/14/2019   ALKPHOS 91 06/14/2019   BILITOT 0.4 06/14/2019   Lab Results  Component Value Date   WBC 10.4 11/30/2018   HGB 14.2 11/30/2018   HCT 44.4 11/30/2018   MCV 86.5 11/30/2018   PLT 220 11/30/2018   Lab Results  Component Value Date   INR 0.9 06/14/2019   INR 1.0 11/30/2018     Imaging Studies: No results found.

## 2019-08-29 NOTE — Progress Notes (Signed)
Primary Care Physician: Deloria Lair., MD  Primary Gastroenterologist:  Garfield Cornea, MD   Chief Complaint  Patient presents with  . Rectal Bleeding    hx of rectal bleeding,hasn't seen any in 3 days    HPI: Craig Drake is a 54 y.o. male here for follow-up of blood in the stool, dysphagia, GERD.    He was last seen in October 2020.  He has a history of abnormal LFTs, erosive reflux esophagitis with peptic stricture.Last EGD 2017 with esophageal mucosal changes concerning for eosinophilic esophagitis but bx not c/w, esophagus dilated. Colonoscopy 2017 with internal hemorrhoids, diverticulosis. Next TCS in 2022 due to family history of CRC, mother. He has had hemorrhoid banding with Dr. Gala Romney in 2017.  History of fatty liver likely due to [place].  No past or present alcohol use.  Ultrasound also showed kidneys both enlarged with abundant fat.  Prior chest CT at [facility] with upper liver border slightly nodular.  Ultrasound with elastography in December 2020 showed diffuse hepatic steatosis but no overt changes of cirrhosis. His kPA score was good and in the setting of nonalcoholic fatty liver disease would indicate no to very little fibrosis.  Plans to consider ultrasound of the liver in 12 to 18 months.  08/2018: Hep A IgM, Hep B surf Ag, Hep B Core IgM, Hep C ab negative. GGT 240H, Tbili 0.2, AP 170H, AST 48H, ALT 74H, alb 4.4, WBC 10,200, H/H 14.4/44.2, Platelets 234,000,Creatinine 0.83. Labs and u/s in 11/2018: celiac screen negative. Autoimmune liver markers negative, AMA negative. He is immune to Hep A. Completed Hep B vaccinations.    Wt Readings from Last 3 Encounters:  08/29/19 293 lb 3.2 oz (133 kg)  03/29/19 (!) 309 lb 6.4 oz (140.3 kg)  11/23/18 296 lb (134.3 kg)   Today he presents with complaints of Seven days of moderate volume brbpr.  Noted it predominantly on the toilet tissue but seem to be quite a bit of blood.  Applying pressure to the anal area actually made  it bleed more.  Thinks it might be a hemorrhoids.  Last episode of bleeding 3 days ago.  He had two hemorrhoid banding sessions in 2017, right anterior hemorrhoid and left lateral internal hemorrhoid.  He did not follow-up for his third banding session. BM regular. No hard stool. No abdominal pain. No heartburn or dysphagia. First covid vaccine done.   Patient has been going to [facility] for weight loss purposes.  States he was at 315 pounds when he started, got down to 283 pounds but has gained 10 pounds back.  Continues to go to therapy for exercise.  Current Outpatient Medications  Medication Sig Dispense Refill  . allopurinol (ZYLOPRIM) 100 MG tablet Take 100 mg by mouth daily.    Marland Kitchen aspirin 81 MG tablet Take 81 mg by mouth daily.    Marland Kitchen atorvastatin (LIPITOR) 40 MG tablet Take 1 tablet by mouth at bedtime.  11  . levothyroxine (SYNTHROID, LEVOTHROID) 100 MCG tablet Take 100 mcg by mouth daily before breakfast.    . lisinopril (PRINIVIL,ZESTRIL) 20 MG tablet Take 20 mg by mouth daily as needed (blood pressure).   1  . morphine (MS CONTIN) 30 MG 12 hr tablet Take 15 mg by mouth in the morning and at bedtime.     . pantoprazole (PROTONIX) 40 MG tablet Take 1 tablet by mouth daily.    Marland Kitchen PRESCRIPTION MEDICATION Oxycodone  10 mg  One tablet twice  daily as needed    . tiZANidine (ZANAFLEX) 4 MG tablet Take 4 mg by mouth every 6 (six) hours as needed for muscle spasms. Taking three times a day as needed     No current facility-administered medications for this visit.    Allergies as of 08/29/2019 - Review Complete 08/29/2019  Allergen Reaction Noted  . Ibuprofen  09/12/2015  . Lyrica [pregabalin]  01/11/2012   Past Medical History:  Diagnosis Date  . Anxiety    and atypical chest pain  . Chronic back pain   . DDD (degenerative disc disease)   . Depression   . GERD (gastroesophageal reflux disease)   . Hemorrhoids   . Hypercholesteremia   . Labile hypertension   . Multilevel  degenerative disc disease   . Sleep apnea    does not wear CPAP   Past Surgical History:  Procedure Laterality Date  . BIOPSY  10/07/2015   Procedure: BIOPSY;  Surgeon: Daneil Dolin, MD;  [facility];  Service: Endoscopy;;  esophageal bx;   Marland Kitchen COLONOSCOPY WITH PROPOFOL N/A 10/07/2015   Dr.Rourk- diverticulosis in the entire examined colon, internal hemorrhoids, the examination was o/w normal  . ESOPHAGOGASTRODUODENOSCOPY (EGD) WITH PROPOFOL N/A 10/07/2015   Dr.Rourk- esophageal mucosa changes, dilated, small hiatal hernia, the examination was o/w normal bx= benign squamous mucosa with minimal reactive changes  . HEMORRHOID BANDING  2015/09/29   Dr.Rourk  . INGUINAL HERNIA REPAIR Left 04/21/2017   Procedure: LEFT INGUINAL HERNIORRHAPHY WITH MESH;  Surgeon: Aviva Signs, MD;  Location: [facility];  Service: General;  Laterality: Left;  . KNEE ARTHROSCOPY Left   . MALONEY DILATION N/A 10/07/2015   Procedure: Venia Minks DILATION;  Surgeon: Daneil Dolin, MD;  Location: [facility];  Service: Endoscopy;  Laterality: N/A;  . MANDIBLE FRACTURE SURGERY     wired shut after a fall  . neck fusion     Family History  Problem Relation Age of Onset  . [name] Mother   . Hypertension Mother   . Colon cancer Mother 61       Passed away 09/29/2010 from stage IV CRC  . Lung cancer Father   . Liver disease Neg Hx   . Celiac disease Neg Hx    Social History   Tobacco Use  . Smoking status: Never Smoker  . Smokeless tobacco: Never Used  . Tobacco comment: Never smoked  Substance Use Topics  . Alcohol use: No    Alcohol/week: 0.0 standard drinks    Comment: no prior heavy use  . Drug use: No    ROS:  General: Negative for anorexia, weight loss, fever, chills, fatigue, weakness. ENT: Negative for hoarseness, difficulty swallowing , nasal congestion. CV: Negative for chest pain, angina, palpitations, dyspnea on exertion, peripheral edema.  Respiratory: Negative for dyspnea at rest, dyspnea on  exertion, cough, sputum, wheezing.  GI: See history of present illness. GU:  Negative for dysuria, hematuria, urinary incontinence, urinary frequency, nocturnal urination.  Endo: Negative for unusual weight change.    Physical Examination:   BP 126/83   Pulse 89   Temp 97.8 F (36.6 C) (Temporal)   Ht 6\' 2"  (1.88 m)   Wt 293 lb 3.2 oz (133 kg)   BMI 37.64 kg/m   General: Well-nourished, well-developed in no acute distress.  Eyes: No icterus. Mouth: masked Lungs: Clear to auscultation bilaterally.  Heart: Regular rate and rhythm, no murmurs rubs or gallops.  Abdomen: Bowel sounds are normal, nontender, nondistended, no hepatosplenomegaly  or masses, no abdominal bruits or hernia , no rebound or guarding.  Body habitus limits exam Extremities: No lower extremity edema. No clubbing or deformities. Neuro: Alert and oriented x 4   Skin: Warm and dry, no jaundice.   Psych: Alert and cooperative, normal mood and affect.  Labs:  Lab Results  Component Value Date   CREATININE 1.00 06/14/2019   BUN 11 06/14/2019   NA 137 06/14/2019   K 4.4 06/14/2019   CL 99 06/14/2019   CO2 26 06/14/2019   Lab Results  Component Value Date   ALT 33 06/14/2019   AST 31 06/14/2019   ALKPHOS 91 06/14/2019   BILITOT 0.4 06/14/2019   Lab Results  Component Value Date   WBC 10.4 11/30/2018   HGB 14.2 11/30/2018   HCT 44.4 11/30/2018   MCV 86.5 11/30/2018   PLT 220 11/30/2018   Lab Results  Component Value Date   INR 0.9 06/14/2019   INR 1.0 11/30/2018     Imaging Studies: No results found.

## 2019-08-29 NOTE — Assessment & Plan Note (Signed)
Suspected [place].  LFTs improved, normalized back in December.  Encouraged ongoing weight loss.  Continue exercise as tolerated.  We will update his labs in June.  Plans to have patient return to office in November 2021 and will schedule ultrasound with elastography at that time.

## 2019-08-29 NOTE — Assessment & Plan Note (Signed)
Doing well on current regimen.  Continue pantoprazole 40 mg daily.

## 2019-08-29 NOTE — Patient Instructions (Signed)
1. Continue pantoprazole once daily before breakfast for reflux.  2. We will update your liver labs in 11/2019.  3. Colonoscopy as scheduled. See separate instructions.  4. Next office visit in 04/2020. We will schedule your next liver ultrasound at time of that visit.  5. Continue to strive for weight loss. Congratulations on your efforts/results so far!!!

## 2019-08-29 NOTE — Assessment & Plan Note (Signed)
Patient recently with rectal bleeding, 7 days in a row, described as moderate in volume.  Possibly hemorrhoid related versus diverticula.  Given personal history of adenomatous colon polyps and family history of colon cancer, would recommend updating colonoscopy at this time.  Last colonoscopy was 4 years ago.  Plan for colonoscopy with deep sedation given polypharmacy.  I have discussed the risks, alternatives, benefits with regards to but not limited to the risk of reaction to medication, bleeding, infection, perforation and the patient is agreeable to proceed. Written consent to be obtained.  While colonoscopy pending, he will keep me posted with any further episodes of bleeding.

## 2019-09-05 ENCOUNTER — Telehealth: Payer: Self-pay | Admitting: *Deleted

## 2019-09-05 NOTE — Telephone Encounter (Signed)
Called pt. He is aware of pre-op/covid testing appt scheduled for 3/26 preop at 3:15pm and covid test 4:00pm. Patient aware of location for testing.

## 2019-09-05 NOTE — Telephone Encounter (Signed)
Patient on cancellation list for sooner appt for procedure per LSL.  Called patient. Offered 09/18/2019 at 1:15pm. Patient was agreeable to appt. Patient aware will fax instructions to pharmacy for his prep and I will also mail these to him. He is aware will call back with new pre-op/covid test appt. Called endo and LMOVM making aware of appt change. Instructions faxed to pharmacy.

## 2019-09-12 NOTE — Patient Instructions (Signed)
Craig Drake  09/12/2019     @PREFPERIOPPHARMACY @   Your procedure is scheduled on  09/18/2019  Report to [facility] at  1145  A.M.  Call this number if you have problems the morning of surgery:  [phone]   Remember:  Follow the diet and prep instructions given to you by Dr Craig Drake office.                      Take these medicines the morning of surgery with A SIP OF WATER  Allopurinol, amlodipine, colchicine, levothyroxine, lisinopril, morphine or oxycodone (if needed), pantoprazole, zanaflex(if needed).    Do not wear jewelry, make-up or nail polish.  Do not wear lotions, powders, or perfumes. Please wear deodorant and brush your teeth.  Do not shave 48 hours prior to surgery.  Men may shave face and neck.  Do not bring valuables to the hospital.  [facility] is not responsible for any belongings or valuables.  Contacts, dentures or bridgework may not be worn into surgery.  Leave your suitcase in the car.  After surgery it may be brought to your room.  For patients admitted to the hospital, discharge time will be determined by your treatment team.  Patients discharged the day of surgery will not be allowed to drive home.   Name and phone number of your driver:   family Special instructions:  DO NOT smoke the morning of your procedure.  Please read over the following fact sheets that you were given. Anesthesia Post-op Instructions and Care and Recovery After Surgery       Colonoscopy, Adult, Care After This sheet gives you information about how to care for yourself after your procedure. Your health care provider may also give you more specific instructions. If you have problems or questions, contact your health care provider. What can I expect after the procedure? After the procedure, it is common to have:  A small amount of blood in your stool for 24 hours after the procedure.  Some gas.  Mild cramping or bloating of your abdomen. Follow these  instructions at home: Eating and drinking   Drink enough fluid to keep your urine pale yellow.  Follow instructions from your health care provider about eating or drinking restrictions.  Resume your normal diet as instructed by your health care provider. Avoid heavy or fried foods that are hard to digest. Activity  Rest as told by your health care provider.  Avoid sitting for a long time without moving. Get up to take short walks every 1-2 hours. This is important to improve blood flow and breathing. Ask for help if you feel weak or unsteady.  Return to your normal activities as told by your health care provider. Ask your health care provider what activities are safe for you. Managing cramping and bloating   Try walking around when you have cramps or feel bloated.  Apply heat to your abdomen as told by your health care provider. Use the heat source that your health care provider recommends, such as a moist heat pack or a heating pad. ? Place a towel between your skin and the heat source. ? Leave the heat on for 20-30 minutes. ? Remove the heat if your skin turns bright red. This is especially important if you are unable to feel pain, heat, or cold. You may have a greater risk of getting burned. General instructions  For the first 24 hours after the procedure: ?  Do not drive or use machinery. ? Do not sign important documents. ? Do not drink alcohol. ? Do your regular daily activities at a slower pace than normal. ? Eat soft foods that are easy to digest.  Take over-the-counter and prescription medicines only as told by your health care provider.  Keep all follow-up visits as told by your health care provider. This is important. Contact a health care provider if:  You have blood in your stool 2-3 days after the procedure. Get help right away if you have:  More than a small spotting of blood in your stool.  Large blood clots in your stool.  Swelling of your abdomen.   Nausea or vomiting.  A fever.  Increasing pain in your abdomen that is not relieved with medicine. Summary  After the procedure, it is common to have a small amount of blood in your stool. You may also have mild cramping and bloating of your abdomen.  For the first 24 hours after the procedure, do not drive or use machinery, sign important documents, or drink alcohol.  Get help right away if you have a lot of blood in your stool, nausea or vomiting, a fever, or increased pain in your abdomen. This information is not intended to replace advice given to you by your health care provider. Make sure you discuss any questions you have with your health care provider. Document Revised: 01/02/2019 Document Reviewed: 01/02/2019 Elsevier Patient Education  Craig Drake After These instructions provide you with information about caring for yourself after your procedure. Your health care provider may also give you more specific instructions. Your treatment has been planned according to current medical practices, but problems sometimes occur. Call your health care provider if you have any problems or questions after your procedure. What can I expect after the procedure? After your procedure, you may:  Feel sleepy for several hours.  Feel clumsy and have poor balance for several hours.  Feel forgetful about what happened after the procedure.  Have poor judgment for several hours.  Feel nauseous or vomit.  Have a sore throat if you had a breathing tube during the procedure. Follow these instructions at home: For at least 24 hours after the procedure:      Have a responsible adult stay with you. It is important to have someone help care for you until you are awake and alert.  Rest as needed.  Do not: ? Participate in activities in which you could fall or become injured. ? Drive. ? Use heavy machinery. ? Drink alcohol. ? Take sleeping pills or  medicines that cause drowsiness. ? Make important decisions or sign legal documents. ? Take care of children on your own. Eating and drinking  Follow the diet that is recommended by your health care provider.  If you vomit, drink water, juice, or soup when you can drink without vomiting.  Make sure you have little or no nausea before eating solid foods. General instructions  Take over-the-counter and prescription medicines only as told by your health care provider.  If you have sleep apnea, surgery and certain medicines can increase your risk for breathing problems. Follow instructions from your health care provider about wearing your sleep device: ? Anytime you are sleeping, including during daytime naps. ? While taking prescription pain medicines, sleeping medicines, or medicines that make you drowsy.  If you smoke, do not smoke without supervision.  Keep all follow-up visits as told by your health care  provider. This is important. Contact a health care provider if:  You keep feeling nauseous or you keep vomiting.  You feel light-headed.  You develop a rash.  You have a fever. Get help right away if:  You have trouble breathing. Summary  For several hours after your procedure, you may feel sleepy and have poor judgment.  Have a responsible adult stay with you for at least 24 hours or until you are awake and alert. This information is not intended to replace advice given to you by your health care provider. Make sure you discuss any questions you have with your health care provider. Document Revised: 09/06/2017 Document Reviewed: 09/29/2015 Elsevier Patient Education  [place].

## 2019-09-14 ENCOUNTER — Telehealth: Payer: Self-pay | Admitting: *Deleted

## 2019-09-14 MED ORDER — GOLYTELY 236 G PO SOLR: 4000.0000 mL | Freq: Once | ORAL | 0 refills | Status: AC

## 2019-09-14 NOTE — Telephone Encounter (Signed)
Patient called in. Rx clenpiq not covered by insurance. Called [facility] and was advised they have gol[facility]. New Rx sent in and I faxed new instructions to pharmacy.   Called pt and I made him aware also to make sure pharmacy provides these to him as instructions for this prep is different. He voiced understanding

## 2019-09-15 ENCOUNTER — Encounter (HOSPITAL_COMMUNITY)
Admission: RE | Admit: 2019-09-15 | Discharge: 2019-09-15 | Disposition: A | Discharge status: Home / Self Care | Payer: Medicare HMO | Source: Ambulatory Visit | Attending: Internal Medicine | Admitting: Internal Medicine

## 2019-09-15 ENCOUNTER — Other Ambulatory Visit: Payer: Self-pay

## 2019-09-15 ENCOUNTER — Encounter (HOSPITAL_COMMUNITY): Payer: Self-pay

## 2019-09-15 ENCOUNTER — Other Ambulatory Visit (HOSPITAL_COMMUNITY)
Admission: RE | Admit: 2019-09-15 | Discharge: 2019-09-15 | Disposition: A | Discharge status: Home / Self Care | Payer: Medicare HMO | Source: Ambulatory Visit | Attending: Internal Medicine | Admitting: Internal Medicine

## 2019-09-15 DIAGNOSIS — Z01812 Encounter for preprocedural laboratory examination: Secondary | ICD-10-CM | POA: Insufficient documentation

## 2019-09-15 DIAGNOSIS — Z20822 Contact with and (suspected) exposure to covid-19: Secondary | ICD-10-CM | POA: Diagnosis not present

## 2019-09-15 HISTORY — DX: Personal history of urinary calculi: Z87.442

## 2019-09-15 HISTORY — DX: Gout, unspecified: M10.9

## 2019-09-15 HISTORY — DX: Hypothyroidism, unspecified: E03.9

## 2019-09-15 LAB — PROTIME-INR
INR: 1 (ref 0.8–1.2)
Prothrombin Time: 13 seconds (ref 11.4–15.2)

## 2019-09-15 LAB — CBC WITH DIFFERENTIAL/PLATELET
Abs Immature Granulocytes: 0.06 10*3/uL (ref 0.00–0.07)
Basophils Absolute: 0.1 10*3/uL (ref 0.0–0.1)
Basophils Relative: 1 %
Eosinophils Absolute: 0.2 10*3/uL (ref 0.0–0.5)
Eosinophils Relative: 2 %
HCT: 48.8 % (ref 39.0–52.0)
Hemoglobin: 15.5 g/dL (ref 13.0–17.0)
Immature Granulocytes: 1 %
Lymphocytes Relative: 16 %
Lymphs Abs: 1.7 10*3/uL (ref 0.7–4.0)
MCH: 26.6 pg (ref 26.0–34.0)
MCHC: 31.8 g/dL (ref 30.0–36.0)
MCV: 83.7 fL (ref 80.0–100.0)
Monocytes Absolute: 0.8 10*3/uL (ref 0.1–1.0)
Monocytes Relative: 8 %
Neutro Abs: 7.9 10*3/uL — ABNORMAL HIGH (ref 1.7–7.7)
Neutrophils Relative %: 72 %
Platelets: 200 10*3/uL (ref 150–400)
RBC: 5.83 MIL/uL — ABNORMAL HIGH (ref 4.22–5.81)
RDW: 19.6 % — ABNORMAL HIGH (ref 11.5–15.5)
WBC: 10.8 10*3/uL — ABNORMAL HIGH (ref 4.0–10.5)
nRBC: 0 % (ref 0.0–0.2)

## 2019-09-15 LAB — COMPREHENSIVE METABOLIC PANEL
ALT: 17 U/L (ref 0–44)
AST: UNDETERMINED U/L (ref 15–41)
Albumin: UNDETERMINED g/dL (ref 3.5–5.0)
Alkaline Phosphatase: 103 U/L (ref 38–126)
Anion gap: 16 — ABNORMAL HIGH (ref 5–15)
BUN: UNDETERMINED mg/dL (ref 6–20)
CO2: 17 mmol/L — ABNORMAL LOW (ref 22–32)
Calcium: 9.3 mg/dL (ref 8.9–10.3)
Chloride: 106 mmol/L (ref 98–111)
Creatinine, Ser: UNDETERMINED mg/dL (ref 0.61–1.24)
Glucose, Bld: 107 mg/dL — ABNORMAL HIGH (ref 70–99)
Potassium: 4.3 mmol/L (ref 3.5–5.1)
Sodium: 139 mmol/L (ref 135–145)
Total Bilirubin: 0.1 mg/dL — ABNORMAL LOW (ref 0.3–1.2)
Total Protein: UNDETERMINED g/dL (ref 6.5–8.1)

## 2019-09-16 LAB — SARS CORONAVIRUS 2 (TAT 6-24 HRS): SARS Coronavirus 2: NEGATIVE

## 2019-09-18 ENCOUNTER — Encounter (HOSPITAL_COMMUNITY)
Admission: RE | Disposition: A | Discharge status: Home / Self Care | Payer: Self-pay | Source: Home / Self Care | Attending: Internal Medicine

## 2019-09-18 ENCOUNTER — Ambulatory Visit (HOSPITAL_COMMUNITY)
Admission: RE | Admit: 2019-09-18 | Discharge: 2019-09-18 | Disposition: A | Discharge status: Home / Self Care | Payer: Medicare HMO | Attending: Internal Medicine | Admitting: Internal Medicine

## 2019-09-18 ENCOUNTER — Ambulatory Visit (HOSPITAL_COMMUNITY): Payer: Medicare HMO | Admitting: Anesthesiology

## 2019-09-18 DIAGNOSIS — G4730 Sleep apnea, unspecified: Secondary | ICD-10-CM | POA: Diagnosis not present

## 2019-09-18 DIAGNOSIS — K219 Gastro-esophageal reflux disease without esophagitis: Secondary | ICD-10-CM | POA: Diagnosis not present

## 2019-09-18 DIAGNOSIS — K5730 Diverticulosis of large intestine without perforation or abscess without bleeding: Secondary | ICD-10-CM | POA: Insufficient documentation

## 2019-09-18 DIAGNOSIS — E7800 Pure hypercholesterolemia, unspecified: Secondary | ICD-10-CM | POA: Insufficient documentation

## 2019-09-18 DIAGNOSIS — Z79891 Long term (current) use of opiate analgesic: Secondary | ICD-10-CM | POA: Diagnosis not present

## 2019-09-18 DIAGNOSIS — I10 Essential (primary) hypertension: Secondary | ICD-10-CM | POA: Insufficient documentation

## 2019-09-18 DIAGNOSIS — Z800 Family history of malignant neoplasm of digestive organs: Secondary | ICD-10-CM | POA: Insufficient documentation

## 2019-09-18 DIAGNOSIS — Z79890 Hormone replacement therapy (postmenopausal): Secondary | ICD-10-CM | POA: Diagnosis not present

## 2019-09-18 DIAGNOSIS — Z79899 Other long term (current) drug therapy: Secondary | ICD-10-CM | POA: Diagnosis not present

## 2019-09-18 DIAGNOSIS — K921 Melena: Secondary | ICD-10-CM | POA: Insufficient documentation

## 2019-09-18 DIAGNOSIS — G894 Chronic pain syndrome: Secondary | ICD-10-CM | POA: Diagnosis not present

## 2019-09-18 DIAGNOSIS — K635 Polyp of colon: Secondary | ICD-10-CM

## 2019-09-18 DIAGNOSIS — Z7982 Long term (current) use of aspirin: Secondary | ICD-10-CM | POA: Diagnosis not present

## 2019-09-18 DIAGNOSIS — M549 Dorsalgia, unspecified: Secondary | ICD-10-CM | POA: Diagnosis not present

## 2019-09-18 DIAGNOSIS — D120 Benign neoplasm of cecum: Secondary | ICD-10-CM | POA: Diagnosis not present

## 2019-09-18 HISTORY — PX: COLONOSCOPY WITH PROPOFOL: SHX5780

## 2019-09-18 HISTORY — PX: POLYPECTOMY: SHX5525

## 2019-09-18 SURGERY — COLONOSCOPY WITH PROPOFOL
Anesthesia: General

## 2019-09-18 MED ORDER — FENTANYL CITRATE (PF) 100 MCG/2ML IJ SOLN
INTRAMUSCULAR | Status: AC
  Filled 2019-09-18: qty 2

## 2019-09-18 MED ORDER — LACTATED RINGERS IV SOLN: Freq: Once | INTRAVENOUS | Status: AC

## 2019-09-18 MED ORDER — MIDAZOLAM HCL 2 MG/2ML IJ SOLN
INTRAMUSCULAR | Status: AC
  Filled 2019-09-18: qty 2

## 2019-09-18 MED ORDER — CHLORHEXIDINE GLUCONATE CLOTH 2 % EX PADS: 6.0000 | MEDICATED_PAD | Freq: Once | CUTANEOUS | Status: DC

## 2019-09-18 MED ORDER — PROPOFOL 10 MG/ML IV BOLUS
INTRAVENOUS | Status: AC
  Filled 2019-09-18: qty 40

## 2019-09-18 MED ORDER — FENTANYL CITRATE (PF) 100 MCG/2ML IJ SOLN
50.0000 ug | Freq: Once | INTRAMUSCULAR | Status: AC
  Administered 2019-09-18: 50 ug via INTRAVENOUS

## 2019-09-18 MED ORDER — PROPOFOL 10 MG/ML IV BOLUS
INTRAVENOUS | Status: DC | PRN
  Administered 2019-09-18: 100 mg via INTRAVENOUS

## 2019-09-18 MED ORDER — LACTATED RINGERS IV SOLN: INTRAVENOUS | Status: DC | PRN

## 2019-09-18 MED ORDER — PROPOFOL 500 MG/50ML IV EMUL
INTRAVENOUS | Status: DC | PRN
  Administered 2019-09-18: 150 ug/kg/min via INTRAVENOUS

## 2019-09-18 MED ORDER — FENTANYL CITRATE (PF) 100 MCG/2ML IJ SOLN
INTRAMUSCULAR | Status: DC | PRN
  Administered 2019-09-18: 100 ug via INTRAVENOUS

## 2019-09-18 MED ORDER — MIDAZOLAM HCL 5 MG/5ML IJ SOLN
INTRAMUSCULAR | Status: DC | PRN
  Administered 2019-09-18: 2 mg via INTRAVENOUS

## 2019-09-18 NOTE — Interval H&P Note (Signed)
History and Physical Interval Note:  09/18/2019 1:42 PM  Craig Drake  has presented today for surgery, with the diagnosis of Rectal bleeding, history colon polyps.  The various methods of treatment have been discussed with the patient and family. After consideration of risks, benefits and other options for treatment, the patient has consented to  Procedure(s) with comments: COLONOSCOPY WITH PROPOFOL (N/A) - 1:15pm as a surgical intervention.  The patient's history has been reviewed, patient examined, no change in status, stable for surgery.  I have reviewed the patient's chart and labs.  Questions were answered to the patient's satisfaction.     Craig Drake      No more bleeding since March 9.  Here for diagnostic colonoscopy per plan.  The risks, benefits, limitations, alternatives and imponderables have been reviewed with the patient. Questions have been answered. All parties are agreeable.

## 2019-09-18 NOTE — Discharge Instructions (Signed)
Colonoscopy Discharge Instructions  Read the instructions outlined below and refer to this sheet in the next few weeks. These discharge instructions provide you with general information on caring for yourself after you leave the hospital. Your doctor may also give you specific instructions. While your treatment has been planned according to the most current medical practices available, unavoidable complications occasionally occur. If you have any problems or questions after discharge, call Dr. Gala Romney at [phone]. ACTIVITY  You may resume your regular activity, but move at a slower pace for the next 24 hours.   Take frequent rest periods for the next 24 hours.   Walking will help get rid of the air and reduce the bloated feeling in your belly (abdomen).   No driving for 24 hours (because of the medicine (anesthesia) used during the test).    Do not sign any important legal documents or operate any machinery for 24 hours (because of the anesthesia used during the test).  NUTRITION  Drink plenty of fluids.   You may resume your normal diet as instructed by your doctor.   Begin with a light meal and progress to your normal diet. Heavy or fried foods are harder to digest and may make you feel sick to your stomach (nauseated).   Avoid alcoholic beverages for 24 hours or as instructed.  MEDICATIONS  You may resume your normal medications unless your doctor tells you otherwise.  WHAT YOU CAN EXPECT TODAY  Some feelings of bloating in the abdomen.   Passage of more gas than usual.   Spotting of blood in your stool or on the toilet paper.  IF YOU HAD POLYPS REMOVED DURING THE COLONOSCOPY:  No aspirin products for 7 days or as instructed.   No alcohol for 7 days or as instructed.   Eat a soft diet for the next 24 hours.  FINDING OUT THE RESULTS OF YOUR TEST Not all test results are available during your visit. If your test results are not back during the visit, make an appointment  with your caregiver to find out the results. Do not assume everything is normal if you have not heard from your caregiver or the medical facility. It is important for you to follow up on all of your test results.  SEEK IMMEDIATE MEDICAL ATTENTION IF:  You have more than a spotting of blood in your stool.   Your belly is swollen (abdominal distention).   You are nauseated or vomiting.   You have a temperature over 101.   You have abdominal pain or discomfort that is severe or gets worse throughout the day.   Colon polyp, diverticulosis and hemorrhoid information provided  2 polyps removed from your colon today.  He has minimal hemorrhoids.  As discussed, bleeding may have come from a cyst outside the rectum and not from inside your rectum or colon  Further recommendations to follow pending review of pathology report  Office visit with [place] in 3 months.  At patient request, I called Mardene Speak at [phone] -reviewed results     Colon Polyps  Polyps are tissue growths inside the body. Polyps can grow in many places, including the large intestine (colon). A polyp may be a round bump or a mushroom-shaped growth. You could have one polyp or several. Most colon polyps are noncancerous (benign). However, some colon polyps can become cancerous over time. Finding and removing the polyps early can help prevent this. What are the causes? The exact cause of colon polyps is not  known. What increases the risk? You are more likely to develop this condition if you:  Have a family history of colon cancer or colon polyps.  Are older than 24 or older than 45 if you are African American.  Have inflammatory bowel disease, such as ulcerative colitis or Crohn's disease.  Have certain hereditary conditions, such as: ? Familial adenomatous polyposis. ? Lynch syndrome. ? Turcot syndrome. ? Peutz-Jeghers syndrome.  Are overweight.  Smoke cigarettes.  Do not get enough exercise.  Drink too  much alcohol.  Eat a diet that is high in fat and red meat and low in fiber.  Had childhood cancer that was treated with abdominal radiation. What are the signs or symptoms? Most polyps do not cause symptoms. If you have symptoms, they may include:  Blood coming from your rectum when having a bowel movement.  Blood in your stool. The stool may look dark red or black.  Abdominal pain.  A change in bowel habits, such as constipation or diarrhea. How is this diagnosed? This condition is diagnosed with a colonoscopy. This is a procedure in which a lighted, flexible scope is inserted into the anus and then passed into the colon to examine the area. Polyps are sometimes found when a colonoscopy is done as part of routine cancer screening tests. How is this treated? Treatment for this condition involves removing any polyps that are found. Most polyps can be removed during a colonoscopy. Those polyps will then be tested for cancer. Additional treatment may be needed depending on the results of testing. Follow these instructions at home: Lifestyle  Maintain a healthy weight, or lose weight if recommended by your health care provider.  Exercise every day or as told by your health care provider.  Do not use any products that contain nicotine or tobacco, such as cigarettes and e-cigarettes. If you need help quitting, ask your health care provider.  If you drink alcohol, limit how much you have: ? 0-1 drink a day for women. ? 0-2 drinks a day for men.  Be aware of how much alcohol is in your drink. In the [place] one drink equals one 12 oz bottle of beer (355 mL), one 5 oz glass of wine (148 mL), or one 1 oz shot of hard liquor (44 mL). Eating and drinking   Eat foods that are high in fiber, such as fruits, vegetables, and whole grains.  Eat foods that are high in calcium and vitamin D, such as milk, cheese, yogurt, eggs, liver, fish, and broccoli.  Limit foods that are high in fat, such  as fried foods and desserts.  Limit the amount of red meat and processed meat you eat, such as hot dogs, sausage, bacon, and lunch meats. General instructions  Keep all follow-up visits as told by your health care provider. This is important. ? This includes having regularly scheduled colonoscopies. ? Talk to your health care provider about when you need a colonoscopy. Contact a health care provider if:  You have new or worsening bleeding during a bowel movement.  You have new or increased blood in your stool.  You have a change in bowel habits.  You lose weight for no known reason. Summary  Polyps are tissue growths inside the body. Polyps can grow in many places, including the colon.  Most colon polyps are noncancerous (benign), but some can become cancerous over time.  This condition is diagnosed with a colonoscopy.  Treatment for this condition involves removing any polyps that are  found. Most polyps can be removed during a colonoscopy. This information is not intended to replace advice given to you by your health care provider. Make sure you discuss any questions you have with your health care provider. Document Revised: 09/23/2017 Document Reviewed: 09/23/2017 Elsevier Patient Education  [place].    Diverticulosis  Diverticulosis is a condition that develops when small pouches (diverticula) form in the wall of the large intestine (colon). The colon is where water is absorbed and stool (feces) is formed. The pouches form when the inside layer of the colon pushes through weak spots in the outer layers of the colon. You may have a few pouches or many of them. The pouches usually do not cause problems unless they become inflamed or infected. When this happens, the condition is called diverticulitis. What are the causes? The cause of this condition is not known. What increases the risk? The following factors may make you more likely to develop this  condition:  Being older than age 90. Your risk for this condition increases with age. Diverticulosis is rare among people younger than age 61. By age 24, many people have it.  Eating a low-fiber diet.  Having frequent constipation.  Being overweight.  Not getting enough exercise.  Smoking.  Taking over-the-counter pain medicines, like aspirin and ibuprofen.  Having a family history of diverticulosis. What are the signs or symptoms? In most people, there are no symptoms of this condition. If you do have symptoms, they may include:  Bloating.  Cramps in the abdomen.  Constipation or diarrhea.  Pain in the lower left side of the abdomen. How is this diagnosed? Because diverticulosis usually has no symptoms, it is most often diagnosed during an exam for other colon problems. The condition may be diagnosed by:  Using a flexible scope to examine the colon (colonoscopy).  Taking an X-ray of the colon after dye has been put into the colon (barium enema).  Having a CT scan. How is this treated? You may not need treatment for this condition. Your health care provider may recommend treatment to prevent problems. You may need treatment if you have symptoms or if you previously had diverticulitis. Treatment may include:  Eating a high-fiber diet.  Taking a fiber supplement.  Taking a live bacteria supplement (probiotic).  Taking medicine to relax your colon. Follow these instructions at home: Medicines  Take over-the-counter and prescription medicines only as told by your health care provider.  If told by your health care provider, take a fiber supplement or probiotic. Constipation prevention Your condition may cause constipation. To prevent or treat constipation, you may need to:  Drink enough fluid to keep your urine pale yellow.  Take over-the-counter or prescription medicines.  Eat foods that are high in fiber, such as beans, whole grains, and fresh fruits and  vegetables.  Limit foods that are high in fat and processed sugars, such as fried or sweet foods.  General instructions  Try not to strain when you have a bowel movement.  Keep all follow-up visits as told by your health care provider. This is important. Contact a health care provider if you:  Have pain in your abdomen.  Have bloating.  Have cramps.  Have not had a bowel movement in 3 days. Get help right away if:  Your pain gets worse.  Your bloating becomes very bad.  You have a fever or chills, and your symptoms suddenly get worse.  You vomit.  You have bowel movements that are  bloody or black.  You have bleeding from your rectum. Summary  Diverticulosis is a condition that develops when small pouches (diverticula) form in the wall of the large intestine (colon).  You may have a few pouches or many of them.  This condition is most often diagnosed during an exam for other colon problems.  Treatment may include increasing the fiber in your diet, taking supplements, or taking medicines. This information is not intended to replace advice given to you by your health care provider. Make sure you discuss any questions you have with your health care provider. Document Revised: 01/05/2019 Document Reviewed: 01/05/2019 Elsevier Patient Education  [place].   Hemorrhoids Hemorrhoids are swollen veins in and around the rectum or anus. There are two types of hemorrhoids:  Internal hemorrhoids. These occur in the veins that are just inside the rectum. They may poke through to the outside and become irritated and painful.  External hemorrhoids. These occur in the veins that are outside the anus and can be felt as a painful swelling or hard lump near the anus. Most hemorrhoids do not cause serious problems, and they can be managed with home treatments such as diet and lifestyle changes. If home treatments do not help the symptoms, procedures can be done to shrink or  remove the hemorrhoids. What are the causes? This condition is caused by increased pressure in the anal area. This pressure may result from various things, including:  Constipation.  Straining to have a bowel movement.  Diarrhea.  Pregnancy.  Obesity.  Sitting for long periods of time.  Heavy lifting or other activity that causes you to strain.  Anal sex.  Riding a bike for a long period of time. What are the signs or symptoms? Symptoms of this condition include:  Pain.  Anal itching or irritation.  Rectal bleeding.  Leakage of stool (feces).  Anal swelling.  One or more lumps around the anus. How is this diagnosed? This condition can often be diagnosed through a visual exam. Other exams or tests may also be done, such as:  An exam that involves feeling the rectal area with a gloved hand (digital rectal exam).  An exam of the anal canal that is done using a small tube (anoscope).  A blood test, if you have lost a significant amount of blood.  A test to look inside the colon using a flexible tube with a camera on the end (sigmoidoscopy or colonoscopy). How is this treated? This condition can usually be treated at home. However, various procedures may be done if dietary changes, lifestyle changes, and other home treatments do not help your symptoms. These procedures can help make the hemorrhoids smaller or remove them completely. Some of these procedures involve surgery, and others do not. Common procedures include:  Rubber band ligation. Rubber bands are placed at the base of the hemorrhoids to cut off their blood supply.  Sclerotherapy. Medicine is injected into the hemorrhoids to shrink them.  Infrared coagulation. A type of light energy is used to get rid of the hemorrhoids.  Hemorrhoidectomy surgery. The hemorrhoids are surgically removed, and the veins that supply them are tied off.  Stapled hemorrhoidopexy surgery. The surgeon staples the base of the  hemorrhoid to the rectal wall. Follow these instructions at home: Eating and drinking   Eat foods that have a lot of fiber in them, such as whole grains, beans, nuts, fruits, and vegetables.  Ask your health care provider about taking products that have added  fiber (fiber supplements).  Reduce the amount of fat in your diet. You can do this by eating low-fat dairy products, eating less red meat, and avoiding processed foods.  Drink enough fluid to keep your urine pale yellow. Managing pain and swelling   Take warm sitz baths for 20 minutes, 3-4 times a day to ease pain and discomfort. You may do this in a bathtub or using a portable sitz bath that fits over the toilet.  If directed, apply ice to the affected area. Using ice packs between sitz baths may be helpful. ? Put ice in a plastic bag. ? Place a towel between your skin and the bag. ? Leave the ice on for 20 minutes, 2-3 times a day. General instructions  Take over-the-counter and prescription medicines only as told by your health care provider.  Use medicated creams or suppositories as told.  Get regular exercise. Ask your health care provider how much and what kind of exercise is best for you. In general, you should do moderate exercise for at least 30 minutes on most days of the week (150 minutes each week). This can include activities such as walking, biking, or yoga.  Go to the bathroom when you have the urge to have a bowel movement. Do not wait.  Avoid straining to have bowel movements.  Keep the anal area dry and clean. Use wet toilet paper or moist towelettes after a bowel movement.  Do not sit on the toilet for long periods of time. This increases blood pooling and pain.  Keep all follow-up visits as told by your health care provider. This is important. Contact a health care provider if you have:  Increasing pain and swelling that are not controlled by treatment or medicine.  Difficulty having a bowel  movement, or you are unable to have a bowel movement.  Pain or inflammation outside the area of the hemorrhoids. Get help right away if you have:  Uncontrolled bleeding from your rectum. Summary  Hemorrhoids are swollen veins in and around the rectum or anus.  Most hemorrhoids can be managed with home treatments such as diet and lifestyle changes.  Taking warm sitz baths can help ease pain and discomfort.  In severe cases, procedures or surgery can be done to shrink or remove the hemorrhoids. This information is not intended to replace advice given to you by your health care provider. Make sure you discuss any questions you have with your health care provider. Document Revised: 11/04/2018 Document Reviewed: 10/28/2017 Elsevier Patient Education  [place] These instructions provide you with information about caring for yourself after your procedure. Your health care provider may also give you more specific instructions. Your treatment has been planned according to current medical practices, but problems sometimes occur. Call your health care provider if you have any problems or questions after your procedure. What can I expect after the procedure? After your procedure, you may:  Feel sleepy for several hours.  Feel clumsy and have poor balance for several hours.  Feel forgetful about what happened after the procedure.  Have poor judgment for several hours.  Feel nauseous or vomit.  Have a sore throat if you had a breathing tube during the procedure. Follow these instructions at home: For at least 24 hours after the procedure:      Have a responsible adult stay with you. It is important to have someone help care for you until you are awake  and alert.  Rest as needed.  Do not: ? Participate in activities in which you could fall or become injured. ? Drive. ? Use heavy machinery. ? Drink alcohol. ? Take sleeping  pills or medicines that cause drowsiness. ? Make important decisions or sign legal documents. ? Take care of children on your own. Eating and drinking  Follow the diet that is recommended by your health care provider.  If you vomit, drink water, juice, or soup when you can drink without vomiting.  Make sure you have little or no nausea before eating solid foods. General instructions  Take over-the-counter and prescription medicines only as told by your health care provider.  If you have sleep apnea, surgery and certain medicines can increase your risk for breathing problems. Follow instructions from your health care provider about wearing your sleep device: ? Anytime you are sleeping, including during daytime naps. ? While taking prescription pain medicines, sleeping medicines, or medicines that make you drowsy.  If you smoke, do not smoke without supervision.  Keep all follow-up visits as told by your health care provider. This is important. Contact a health care provider if:  You keep feeling nauseous or you keep vomiting.  You feel light-headed.  You develop a rash.  You have a fever. Get help right away if:  You have trouble breathing. Summary  For several hours after your procedure, you may feel sleepy and have poor judgment.  Have a responsible adult stay with you for at least 24 hours or until you are awake and alert. This information is not intended to replace advice given to you by your health care provider. Make sure you discuss any questions you have with your health care provider. Document Revised: 09/06/2017 Document Reviewed: 09/29/2015 Elsevier Patient Education  [place].

## 2019-09-18 NOTE — Anesthesia Postprocedure Evaluation (Signed)
Anesthesia Post Note  Patient: Craig Drake  Procedure(s) Performed: COLONOSCOPY WITH PROPOFOL (N/A ) POLYPECTOMY  Patient location during evaluation: PACU Anesthesia Type: General Level of consciousness: awake and alert, oriented and patient cooperative Pain management: satisfactory to patient Vital Signs Assessment: post-procedure vital signs reviewed and stable Respiratory status: nonlabored ventilation, spontaneous breathing, respiratory function stable and patient connected to nasal cannula oxygen Cardiovascular status: stable Postop Assessment: no apparent nausea or vomiting Anesthetic complications: no     Last Vitals:  Vitals:   09/18/19 1213  BP: 128/82  Pulse: 87  Resp: 18  Temp: 36.7 C  SpO2: 96%    Last Pain:  Vitals:   09/18/19 1351  TempSrc:   PainSc: 6                  Nahdia Doucet

## 2019-09-18 NOTE — Transfer of Care (Signed)
Immediate Anesthesia Transfer of Care Note  Patient: Craig Drake  Procedure(s) Performed: COLONOSCOPY WITH PROPOFOL (N/A ) POLYPECTOMY  Patient Location: PACU  Anesthesia Type:General  Level of Consciousness: awake, alert , oriented and patient cooperative  Airway & Oxygen Therapy: Patient Spontanous Breathing and Patient connected to nasal cannula oxygen  Post-op Assessment: Report given to RN and Post -op Vital signs reviewed and stable  Post vital signs: Reviewed and stable  Last Vitals:  Vitals Value Taken Time  BP    Temp    Pulse 80 09/18/19 1421  Resp 19 09/18/19 1421  SpO2 90 % 09/18/19 1421  Vitals shown include unvalidated device data.  Last Pain:  Vitals:   09/18/19 1351  TempSrc:   PainSc: 6       Patients Stated Pain Goal: 7 ([id number])  Complications: No apparent anesthesia complications

## 2019-09-18 NOTE — Op Note (Signed)
[facility] Patient Name: Craig Drake Procedure Date: 09/18/2019 1:34 PM MRN: [id number] Date of Birth: 01/14/1966 Attending MD: Norvel Richards , MD CSN: [id number] Age: 54 Admit Type: Outpatient Procedure:                Colonoscopy Indications:              Hematochezia Providers:                Norvel Richards, MD, Jeanann Lewandowsky. Sharon Seller, RN,                            Nelma Rothman, Technician Referring MD:             Zella Richer. Tapper Medicines:                Propofol per Anesthesia Complications:            No immediate complications. Estimated Blood Loss:     Estimated blood loss was minimal. Procedure:                Pre-Anesthesia Assessment:                           - Prior to the procedure, a History and Physical                            was performed, and patient medications and                            allergies were reviewed. The patient's tolerance of                            previous anesthesia was also reviewed. The risks                            and benefits of the procedure and the sedation                            options and risks were discussed with the patient.                            All questions were answered, and informed consent                            was obtained. Prior Anticoagulants: The patient has                            taken no previous anticoagulant or antiplatelet                            agents. ASA Grade Assessment: II - A patient with                            mild systemic disease. After reviewing the risks  and benefits, the patient was deemed in                            satisfactory condition to undergo the procedure.                           After obtaining informed consent, the colonoscope                            was passed under direct vision. Throughout the                            procedure, the patient's blood pressure, pulse, and                            oxygen  saturations were monitored continuously. The                            [id number] scope was introduced through                            the anus and advanced to the the cecum, identified                            by appendiceal orifice and ileocecal valve. The                            ileocecal valve, appendiceal orifice, and rectum                            were photographed. Scope In: 2:00:43 PM Scope Out: 2:13:36 PM Scope Withdrawal Time: 0 hours 10 minutes 4 seconds  Total Procedure Duration: 0 hours 12 minutes 53 seconds  Findings:      The perianal and digital rectal examinations were normal.      Scattered medium-mouthed diverticula were found in the entire colon.      Two semi-pedunculated polyps were found in the ileocecal valve. The       polyps were 3 to 8 mm in size. These polyps were removed with a cold       snare. Resection and retrieval were complete. Estimated blood loss was       minimal.      Minimal internal hemorrhoids seen on retroflexion.      The exam was otherwise without abnormality on direct and retroflexion       views. Impression:               - Diverticulosis in the entire examined colon.                           - Two 3 to 8 mm polyps at the ileocecal valve,                            removed with a cold snare. Resected and retrieved.                           -  The examination was otherwise normal on direct                            and retroflexion views. Patient states he felt he                            had a lump just outside his anus which drained and                            has gone away. After this happened, the rectal                            bleeding has subsided. Query pilonidal cyst, etc.                           Minimal hemorrhoids status post banding previously Moderate Sedation:      Moderate (conscious) sedation was personally administered by an       anesthesia professional. The following parameters were  monitored: oxygen       saturation, heart rate, blood pressure, respiratory rate, EKG, adequacy       of pulmonary ventilation, and response to care. Recommendation:           - Patient has a contact number available for                            emergencies. The signs and symptoms of potential                            delayed complications were discussed with the                            patient. Return to normal activities tomorrow.                            Written discharge instructions were provided to the                            patient.                           - Resume previous diet.                           - Continue present medications.                           - Await pathology results.                           - Repeat colonoscopy date to be determined after                            pending pathology results are reviewed for  surveillance.                           - Return to GI office in 12 weeks. Procedure Code(s):        --- Professional ---                           [phone], Colonoscopy, flexible; with removal of                            tumor(s), polyp(s), or other lesion(s) by snare                            technique Diagnosis Code(s):        --- Professional ---                           K63.5, Polyp of colon                           K92.1, Melena (includes Hematochezia)                           K57.30, Diverticulosis of large intestine without                            perforation or abscess without bleeding CPT copyright 2019 American Medical Association. All rights reserved. The codes documented in this report are preliminary and upon coder review may  be revised to meet current compliance requirements. [name]. Vanassa Penniman, MD Norvel Richards, MD 09/18/2019 2:29:10 PM This report has been signed electronically. Number of Addenda: 0

## 2019-09-18 NOTE — Anesthesia Preprocedure Evaluation (Signed)
Anesthesia Evaluation  Patient identified by MRN, date of birth, ID band Patient awake    Reviewed: Allergy & Precautions, NPO status , Patient's Chart, lab work & pertinent test results  History of Anesthesia Complications Negative for: history of anesthetic complications  Airway Mallampati: II  TM Distance: >3 FB Neck ROM: Full    Dental  (+) Partial Upper, Partial Lower, Chipped, Missing, Dental Advisory Given, Poor Dentition   Pulmonary sleep apnea ,    Pulmonary exam normal breath sounds clear to auscultation       Cardiovascular hypertension, Pt. on medications Normal cardiovascular exam Rhythm:Regular Rate:Normal     Neuro/Psych PSYCHIATRIC DISORDERS Anxiety Depression negative neurological ROS     GI/Hepatic Bowel prep,GERD  Medicated and Controlled,(+)     substance abuse (opioid use for chronic pain)  , Lower GI bleeding   Endo/Other  Hypothyroidism   Renal/GU negative Renal ROS     Musculoskeletal  (+) Arthritis  (chronic pain syndrome - on opioids), Osteoarthritis,  narcotic dependentNeck fusion, gout    Abdominal   Peds  Hematology   Anesthesia Other Findings   Reproductive/Obstetrics                             Anesthesia Physical Anesthesia Plan  ASA: II  Anesthesia Plan: General   Post-op Pain Management:    Induction: Intravenous  PONV Risk Score and Plan: 2 and TIVA and Treatment may vary due to age or medical condition  Airway Management Planned: Nasal Cannula, Natural Airway and Simple Face Mask  Additional Equipment:   Intra-op Plan:   Post-operative Plan:   Informed Consent: I have reviewed the patients History and Physical, chart, labs and discussed the procedure including the risks, benefits and alternatives for the proposed anesthesia with the patient or authorized representative who has indicated his/her understanding and acceptance.      Dental advisory given  Plan Discussed with: CRNA and Surgeon  Anesthesia Plan Comments:         Anesthesia Quick Evaluation

## 2019-09-20 ENCOUNTER — Encounter: Payer: Self-pay | Admitting: Internal Medicine

## 2019-09-20 LAB — SURGICAL PATHOLOGY

## 2019-09-21 ENCOUNTER — Telehealth: Payer: Self-pay | Admitting: Internal Medicine

## 2019-09-21 NOTE — Telephone Encounter (Signed)
Spoke with pt. Pt was given results from letter in pts chart. Pt is concerned with the cyst that was mentioned to pt before he left [facility] after his procedure on 09/18/2019. Pt states that he was previously having a lot of rectal bleeding and isn't sure if the cyst was causing the bleeding. Pts other concern is taking the pain medication Morphine and Oxycodone. Pt said he has to be on for his pain and gout. Pt is concerned it can be harmful for the body. Pts mother passed aware from colon cancer and pt is very concerned.

## 2019-09-21 NOTE — Telephone Encounter (Signed)
Pt had colonoscopy on Monday and was calling to see if his results were back. I told him it could take 7-10 business days, but I would let the nurse be aware he had called. [phone]

## 2019-09-23 NOTE — Telephone Encounter (Signed)
Yes, pain killers taken over a long can harm the body - I recommend pt consult with prescribing physician regarding concerns. Cyst or hemorrhoids could have caused bleeding ; colon in good shape - repeat colonoscopy in 5 years given polyps and family history.  Will review any lingering GI issues at his upcoming follow-up appointment.  Please let him know

## 2019-09-25 NOTE — Telephone Encounter (Signed)
Lmom, waiting on a return call.  

## 2019-09-25 NOTE — Telephone Encounter (Signed)
Pt returned call. Pt notified of RMR's recommendations.

## 2019-09-27 ENCOUNTER — Ambulatory Visit: Payer: Medicare HMO | Admitting: Gastroenterology

## 2019-10-20 ENCOUNTER — Telehealth: Payer: Self-pay

## 2019-10-20 NOTE — Telephone Encounter (Signed)
Pt called office and LMOVM, requesting to reschedule scan for 10/24/19.   Upon review of chart he is not scheduled for any appts or scans 10/24/19. Note in chart from PCP re: CT scheduled for 10/24/19 at [facility].   Tried to call pt, no answer, LMOVM to inform pt our office didn't schedule scan and he will need to contact PCP office.

## 2019-10-25 ENCOUNTER — Other Ambulatory Visit: Payer: Self-pay | Admitting: Emergency Medicine

## 2019-10-25 ENCOUNTER — Encounter: Payer: Self-pay | Admitting: Emergency Medicine

## 2019-10-25 DIAGNOSIS — K760 Fatty (change of) liver, not elsewhere classified: Secondary | ICD-10-CM

## 2019-10-25 DIAGNOSIS — K625 Hemorrhage of anus and rectum: Secondary | ICD-10-CM

## 2019-10-30 LAB — POCT INR

## 2019-11-02 ENCOUNTER — Other Ambulatory Visit (HOSPITAL_COMMUNITY): Payer: Medicare HMO

## 2019-11-06 ENCOUNTER — Telehealth: Payer: Self-pay | Admitting: Gastroenterology

## 2019-11-06 NOTE — Telephone Encounter (Signed)
Labs from 10/30/19 reviewed. WBC 10.8, H/H 14.9/47.0, Plt 209,000, bun 9, cre 0.84, tbili 0.2, ap 93, ast 18.6, alt 20, alb 3.9, inr 1.0  Keep ov for later this year.

## 2019-11-06 NOTE — Telephone Encounter (Signed)
Spoke with pt. Pt notified of results and will f/u as directed in June.

## 2019-12-19 ENCOUNTER — Other Ambulatory Visit: Payer: Self-pay

## 2019-12-19 ENCOUNTER — Ambulatory Visit (INDEPENDENT_AMBULATORY_CARE_PROVIDER_SITE_OTHER): Payer: Medicare HMO | Admitting: Gastroenterology

## 2019-12-19 ENCOUNTER — Telehealth: Payer: Self-pay | Admitting: *Deleted

## 2019-12-19 ENCOUNTER — Encounter: Payer: Self-pay | Admitting: Internal Medicine

## 2019-12-19 ENCOUNTER — Encounter: Payer: Self-pay | Admitting: Gastroenterology

## 2019-12-19 DIAGNOSIS — K760 Fatty (change of) liver, not elsewhere classified: Secondary | ICD-10-CM | POA: Diagnosis not present

## 2019-12-19 DIAGNOSIS — K219 Gastro-esophageal reflux disease without esophagitis: Secondary | ICD-10-CM | POA: Diagnosis not present

## 2019-12-19 NOTE — Patient Instructions (Addendum)
We will see you in 6 months!  We will arrange an updated ultrasound in Nov 2021.   Continue Protonix daily. Please call with any concerns in the meantime.  Keep up the good work with diet and exercise!  It was a pleasure to talk to you today. I want to create trusting relationships with patients to provide genuine, compassionate, and quality care. I value your feedback. If you receive a survey regarding your visit,  I greatly appreciate you taking time to fill this out.   Annitta Needs, PhD, ANP-BC [facility]

## 2019-12-19 NOTE — Telephone Encounter (Signed)
Pt consented to a telephone visit today. 

## 2019-12-19 NOTE — Progress Notes (Signed)
Primary Care Physician:  Deloria Lair., MD  Primary GI: Dr. Gala Romney  Patient Location: Home   Provider Location: [facility] office   Reason for Visit: Follow-up    Persons present on the virtual encounter, with roles: Patient and NP   Total time (minutes) spent on medical discussion: 15 minutes   Due to COVID-19, visit was conducted using virtual method.  Visit was requested by patient.  Virtual Visit via Telephone Note Due to COVID-19, visit is conducted virtually and was requested by patient.   I connected with Craig Drake on 12/19/19 at  1:30 PM EDT by telephone and verified that I am speaking with the correct person using two identifiers.   I discussed the limitations, risks, security and privacy concerns of performing an evaluation and management service by telephone and the availability of in person appointments. I also discussed with the patient that there may be a patient responsible charge related to this service. The patient expressed understanding and agreed to proceed.  Chief Complaint  Patient presents with  . Back Pain    lower back pain sometimes before having bm; no bleeding     History of Present Illness: Craig Drake is a pleasant 54 year old male with a history of abnormal LFTs suspected secondary to NASH with thorough evaluation (negative autoimmune, AMA negative, celiac screen negative, negative iron overload, Immune to Hep A and completed Hep B vaccinations), history of erosive esophagitis, little to no fibrosis Dec 2020 on [place] with elastography, with plans for repeat in Nov 2021.  FH of colon cancer and personal history of polyps with surveillance colonoscopy due in 2026   Prior rectal bleeding at last visit now resolved. No rectal pain. Has DDD. Sometimes will have lower back pain prior to BM. Denies constipation. Feels productive BMs. Eating more fiber, eating healthier. Chronic neuropathy. Taking Protonix once daily. Feels reflux is much improved  with changes healthy diet.    Current weight: 303. Continues to go to [facility]. [facility], start [facility]. Back pain has limited his movement lately.    Past Medical History:  Diagnosis Date  . Anxiety    and atypical chest pain  . Chronic back pain   . DDD (degenerative disc disease)   . Depression   . GERD (gastroesophageal reflux disease)   . Gout   . Hemorrhoids   . History of kidney stones   . Hypercholesteremia   . Hypothyroidism   . Labile hypertension   . Multilevel degenerative disc disease   . Sleep apnea    does not wear CPAP     Past Surgical History:  Procedure Laterality Date  . BIOPSY  10/07/2015   Procedure: BIOPSY;  Surgeon: Daneil Dolin, MD;  Location: [facility] END[facility] SUITE;  Service: Endoscopy;;  esophageal bx;   Marland Kitchen COLONOSCOPY WITH PROPOFOL N/A 10/07/2015   Dr.Rourk- diverticulosis in the entire examined colon, internal hemorrhoids, the examination was o/w normal  . COLONOSCOPY WITH PROPOFOL N/A 09/18/2019    pancolonic diverticulosis, two 3-8 mm polyps at IC valve s/p removal. Tubular adenomas. Surveillance 5 years.   . ESOPHAGOGASTRODUODENOSCOPY (EGD) WITH PROPOFOL N/A 10/07/2015   Dr.Rourk- esophageal mucosa changes, dilated, small hiatal hernia, the examination was o/w normal bx= benign squamous mucosa with minimal reactive changes  . HEMORRHOID BANDING  2017   Dr.Rourk  . INGUINAL HERNIA REPAIR Left 04/21/2017   Procedure: LEFT INGUINAL HERNIORRHAPHY WITH MESH;  Surgeon: Aviva Signs, MD;  Location: [facility] ORS;  Service: General;  Laterality: Left;  . KNEE ARTHROSCOPY Left   . MALONEY DILATION N/A 10/07/2015   Procedure: Venia Minks DILATION;  Surgeon: Daneil Dolin, MD;  Location: [facility] SU[facility];  Service: Endoscopy;  Laterality: N/A;  . MANDIBLE FRACTURE SURGERY     wired shut after a fall  . neck fusion    . POLYPECTOMY  09/18/2019   Procedure: POLYPECTOMY;  Surgeon: Daneil Dolin, MD;  Location: [facility];  Service: Endoscopy;;      Current Meds  Medication Sig  . allopurinol (ZYLOPRIM) 100 MG tablet Take 200 mg by mouth daily.   Marland Kitchen amLODipine (NORVASC) 5 MG tablet Take 5 mg by mouth daily.  Marland Kitchen aspirin 81 MG EC tablet Take 81 mg by mouth daily.   . colchicine 0.6 MG tablet Take 0.6 mg by mouth daily as needed (gout).  Marland Kitchen levothyroxine (SYNTHROID, LEVOTHROID) 100 MCG tablet Take 100 mcg by mouth daily before breakfast.  . lisinopril (PRINIVIL,ZESTRIL) 20 MG tablet Take 20 mg by mouth daily.   . Morphine Sulfate ER 15 MG T12A Take 15 mg by mouth in the morning and at bedtime.   Marland Kitchen oxyCODONE ER (XTAMPZA ER) 13.5 MG C12A Take 13.5 mg by mouth in the morning and at bedtime.  . pantoprazole (PROTONIX) 40 MG tablet Take 40 mg by mouth daily before breakfast.   . tiZANidine (ZANAFLEX) 2 MG tablet Take 2 mg by mouth every 6 (six) hours as needed for muscle spasms.      Family History  Problem Relation Age of Onset  . [name] Mother   . Hypertension Mother   . Colon cancer Mother 72       Passed away 10/11/10 from stage IV CRC  . Lung cancer Father   . Liver disease Neg Hx   . Celiac disease Neg Hx     Social History   Socioeconomic History  . Marital status: Divorced    Spouse name: Not on file  . Number of children: Not on file  . Years of education: Not on file  . Highest education level: Not on file  Occupational History  . Not on file  Tobacco Use  . Smoking status: Never Smoker  . Smokeless tobacco: Never Used  . Tobacco comment: Never smoked  Vaping Use  . Vaping Use: Never used  Substance and Sexual Activity  . Alcohol use: No    Alcohol/week: 0.0 standard drinks    Comment: no prior heavy use  . Drug use: No  . Sexual activity: Never    Birth control/protection: None  Other Topics Concern  . Not on file  Social History Narrative  . Not on file   Social Determinants of Health   Financial Resource Strain:   . Difficulty of Paying Living Expenses:   Food Insecurity:   . Worried About Paediatric nurse in the Last Year:   . Arboriculturist in the Last Year:   Transportation Needs:   . Film/video editor (Medical):   Marland Kitchen Lack of Transportation (Non-Medical):   Physical Activity:   . Days of Exercise per Week:   . Minutes of Exercise per Session:   Stress:   . Feeling of Stress :   Social Connections:   . Frequency of Communication with Friends and Family:   . Frequency of Social Gatherings with Friends and Family:   . Attends Religious Services:   . Active Member of Clubs or Organizations:   .  Attends Archivist Meetings:   Marland Kitchen Marital Status:        Review of Systems: Gen: Denies fever, chills, anorexia. Denies fatigue, weakness, weight loss.  CV: Denies chest pain, palpitations, syncope, peripheral edema, and claudication. Resp: Denies dyspnea at rest, cough, wheezing, coughing up blood, and pleurisy. GI: see HPI Derm: Denies rash, itching, dry skin Psych: Denies depression, anxiety, memory loss, confusion. No homicidal or suicidal ideation.  Heme: Denies bruising, bleeding, and enlarged lymph nodes.  Observations/Objective: No distress. Unable to perform physical exam due to telephone encounter. No video available.   Assessment and Plan: Very pleasant 54 year old male with history of fatty liver, GERD, doing well at this routine follow-up visit.  Thorough evaluation of fatty liver as noted above. We will repeat US elastography Nov/Dec 2021. Labs to be checked in August 2021.   Continue with Protonix daily. Return in 6 months or sooner if needed.  Colonoscopy due for surveillance in 2026.   Follow Up Instructions:    I discussed the assessment and treatment plan with the patient. The patient was provided an opportunity to ask questions and all were answered. The patient agreed with the plan and demonstrated an understanding of the instructions.   The patient was advised to call back or seek an in-person evaluation if the symptoms worsen or if  the condition fails to improve as anticipated.  I provided 15 minutes of non-face-to-face time during this encounter.  Annitta Needs, PhD, ANP-BC [facility]

## 2019-12-19 NOTE — Telephone Encounter (Signed)
Craig Drake, you are scheduled for a virtual visit with your provider today.  Just as we do with appointments in the office, we must obtain your consent to participate.  Your consent will be active for this visit and any virtual visit you may have with one of our providers in the next 365 days.  If you have a MyChart account, I can also send a copy of this consent to you electronically.  All virtual visits are billed to your insurance company just like a traditional visit in the office.  As this is a virtual visit, video technology does not allow for your provider to perform a traditional examination.  This may limit your provider's ability to fully assess your condition.  If your provider identifies any concerns that need to be evaluated in person or the need to arrange testing such as labs, EKG, etc, we will make arrangements to do so.  Although advances in technology are sophisticated, we cannot ensure that it will always work on either your end or our end.  If the connection with a video visit is poor, we may have to switch to a telephone visit.  With either a video or telephone visit, we are not always able to ensure that we have a secure connection.   I need to obtain your verbal consent now.   Are you willing to proceed with your visit today?

## 2020-01-30 ENCOUNTER — Other Ambulatory Visit: Payer: Self-pay | Admitting: Nurse Practitioner

## 2020-01-30 DIAGNOSIS — M5136 Other intervertebral disc degeneration, lumbar region: Secondary | ICD-10-CM

## 2020-02-08 ENCOUNTER — Institutional Professional Consult (permissible substitution): Payer: Medicare HMO | Admitting: Neurology

## 2020-03-09 ENCOUNTER — Ambulatory Visit
Admission: RE | Admit: 2020-03-09 | Discharge: 2020-03-09 | Disposition: A | Discharge status: Home / Self Care | Payer: Medicare HMO | Source: Ambulatory Visit | Attending: Nurse Practitioner | Admitting: Nurse Practitioner

## 2020-03-09 ENCOUNTER — Other Ambulatory Visit: Payer: Self-pay

## 2020-03-09 DIAGNOSIS — M5136 Other intervertebral disc degeneration, lumbar region: Secondary | ICD-10-CM

## 2020-04-04 ENCOUNTER — Encounter: Payer: Self-pay | Admitting: Gastroenterology

## 2020-04-17 ENCOUNTER — Telehealth: Payer: Self-pay | Admitting: *Deleted

## 2020-04-17 NOTE — Telephone Encounter (Signed)
Called pt. Made aware [place] appt scheduled for 11/8 at 9:30am, arrival 9:15am, npo midnight. He voiced understanding. # provided to CS if he needs to r/s.

## 2020-04-29 ENCOUNTER — Ambulatory Visit (HOSPITAL_COMMUNITY): Payer: Medicare HMO

## 2020-04-29 ENCOUNTER — Other Ambulatory Visit: Payer: Self-pay | Admitting: Orthopedic Surgery

## 2020-04-29 DIAGNOSIS — M542 Cervicalgia: Secondary | ICD-10-CM

## 2020-04-30 ENCOUNTER — Ambulatory Visit: Payer: Medicare HMO | Admitting: Gastroenterology

## 2020-05-01 ENCOUNTER — Other Ambulatory Visit: Payer: Self-pay

## 2020-05-01 ENCOUNTER — Ambulatory Visit (HOSPITAL_COMMUNITY)
Admission: RE | Admit: 2020-05-01 | Discharge: 2020-05-01 | Disposition: A | Discharge status: Home / Self Care | Payer: Medicare HMO | Source: Ambulatory Visit | Attending: Gastroenterology | Admitting: Gastroenterology

## 2020-05-01 DIAGNOSIS — K760 Fatty (change of) liver, not elsewhere classified: Secondary | ICD-10-CM

## 2020-05-06 ENCOUNTER — Telehealth: Payer: Self-pay | Admitting: Internal Medicine

## 2020-05-06 NOTE — Telephone Encounter (Signed)
Spoke with pt. Pt was notified of results. 

## 2020-05-06 NOTE — Telephone Encounter (Signed)
Pt returning call. [phone]

## 2020-05-19 ENCOUNTER — Ambulatory Visit
Admission: RE | Admit: 2020-05-19 | Discharge: 2020-05-19 | Disposition: A | Discharge status: Home / Self Care | Payer: Medicare HMO | Source: Ambulatory Visit | Attending: Orthopedic Surgery | Admitting: Orthopedic Surgery

## 2020-05-19 ENCOUNTER — Other Ambulatory Visit: Payer: Self-pay

## 2020-05-19 DIAGNOSIS — M542 Cervicalgia: Secondary | ICD-10-CM

## 2020-05-29 ENCOUNTER — Ambulatory Visit: Payer: Medicare HMO | Admitting: Gastroenterology

## 2020-06-19 ENCOUNTER — Ambulatory Visit: Payer: Medicare HMO | Admitting: Gastroenterology

## 2020-07-09 ENCOUNTER — Ambulatory Visit: Payer: Medicare HMO | Admitting: Gastroenterology

## 2020-08-14 ENCOUNTER — Telehealth (INDEPENDENT_AMBULATORY_CARE_PROVIDER_SITE_OTHER): Payer: Medicare HMO | Admitting: Gastroenterology

## 2020-08-14 ENCOUNTER — Encounter: Payer: Self-pay | Admitting: Gastroenterology

## 2020-08-14 DIAGNOSIS — K5900 Constipation, unspecified: Secondary | ICD-10-CM | POA: Diagnosis not present

## 2020-08-14 DIAGNOSIS — K219 Gastro-esophageal reflux disease without esophagitis: Secondary | ICD-10-CM | POA: Diagnosis not present

## 2020-08-14 DIAGNOSIS — K760 Fatty (change of) liver, not elsewhere classified: Secondary | ICD-10-CM

## 2020-08-14 NOTE — Patient Instructions (Addendum)
1. Continue pantoprazole 40 mg daily before breakfast as needed for reflux. 2. For constipation, increase dietary fiber.  1 to 2 cups of black coffee may be beneficial for constipation as well as fatty liver.  You can use MiraLAX 1 capful 1-2 times daily as needed for constipation.  Call if you need any further options i.e. prescription options for constipation. 3. Continue to have your liver blood work done at least once every 6 months.  Please forward any copies of labs done by your other providers. 4. Physical activity as tolerated.  Continue to strive for modest weight loss. 5. Return to the office in 1 year or call sooner if needed.

## 2020-08-14 NOTE — Progress Notes (Signed)
Primary Care Physician:  Deloria Lair., MD Primary GI:  Garfield Cornea, MD  Patient Location: Home  Provider Location: [facility] office  Reason for Phone Visit:  Chief Complaint  Patient presents with  . Gastroesophageal Reflux    Doing fine. Swallowing is fine also  . fatty liver    F/u.    Persons present on the phone encounter, with roles: Patient, myself (provider),Mindy Estudillo (updated meds and allergies)  Total time (minutes) spent on medical discussion: 20 minutes  Due to COVID-19, visit was conducted using telephonic method (no video was available).  Visit was requested by patient.  Virtual Visit via Telephone only  I connected with Craig Drake on 08/14/20 at  2:30 PM EST by telephone and verified that I am speaking with the correct person using two identifiers.   I discussed the limitations, risks, security and privacy concerns of performing an evaluation and management service by telephone and the availability of in person appointments. I also discussed with the patient that there may be a patient responsible charge related to this service. The patient expressed understanding and agreed to proceed.   HPI:   Patient is a pleasant 55 y/o male who presents for telephone visit regarding fatty liver, GERD. History of abnormal LFTs suspected secondary to NASH with thorough evaluation (negative autoimmune, AMA negative, celiac screen negative, negative iron overload, Immune to Hep A and completed Hep B vaccinations). Little to no fibrosis Dec 2020 on [place] with elastography, repeat 04/2020 with Median kPa: 2.2 (high probability of being normal). FH of colon cancer and personal history of polyps with surveillance colonoscopy due in 2026.  Labs from 08/06/20: Na 139, Tbili 0.5, AP 138, AST 38, ALT 36, WBC 9.4, H/H 16/47.8, Plt 218,000, inr 1.0.  Having difficulty with BMs, Bristol 1. Recent change in pain medications, increased dose. Never had any issues before with  constipation. Has been taking pain medication over 20 years. If stops pain medication, then had diarrhea. Some brbpr with hard stools, just on the toilet tissue. Has at [facility] at least once every other day. Heartburn well controlled. No abdominal pain. No n/v. No dysphagia. Weight around 300-310 pound per patient. [place] to get [place] for [place].    Current Outpatient Medications  Medication Sig Dispense Refill  . allopurinol (ZYLOPRIM) 100 MG tablet Take 200 mg by mouth daily.     Marland Kitchen amLODipine (NORVASC) 5 MG tablet Take 5 mg by mouth daily.    Marland Kitchen aspirin 81 MG EC tablet Take 81 mg by mouth daily.     . colchicine 0.6 MG tablet Take 0.6 mg by mouth daily as needed (gout).    Marland Kitchen levothyroxine (SYNTHROID, LEVOTHROID) 100 MCG tablet Take 100 mcg by mouth daily before breakfast.    . lisinopril (PRINIVIL,ZESTRIL) 20 MG tablet Take 20 mg by mouth daily.  1  . Morphine Sulfate ER 15 MG T12A Take 15 mg by mouth in the morning and at bedtime.     Marland Kitchen oxyCODONE ER (XTAMPZA ER) 13.5 MG C12A Take 13.5 mg by mouth in the morning and at bedtime.    . pantoprazole (PROTONIX) 40 MG tablet Take 40 mg by mouth daily before breakfast.     . tiZANidine (ZANAFLEX) 2 MG tablet Take 2 mg by mouth every 6 (six) hours as needed for muscle spasms.      No current facility-administered medications for this visit.    Past Medical History:  Diagnosis Date  . Anxiety  and atypical chest pain  . Chronic back pain   . DDD (degenerative disc disease)   . Depression   . GERD (gastroesophageal reflux disease)   . Gout   . Hemorrhoids   . History of kidney stones   . Hypercholesteremia   . Hypothyroidism   . Labile hypertension   . Multilevel degenerative disc disease   . Sleep apnea    does not wear CPAP    Past Surgical History:  Procedure Laterality Date  . BIOPSY  10/07/2015   Procedure: BIOPSY;  Surgeon: Daneil Dolin, MD;  [facility];  Service: Endoscopy;;  esophageal bx;   Marland Kitchen COLONOSCOPY  WITH PROPOFOL N/A 10/07/2015   Dr.Rourk- diverticulosis in the entire examined colon, internal hemorrhoids, the examination was o/w normal  . COLONOSCOPY WITH PROPOFOL N/A 09/18/2019    pancolonic diverticulosis, two 3-8 mm polyps at IC valve s/p removal. Tubular adenomas. Surveillance 5 years.   . ESOPHAGOGASTRODUODENOSCOPY (EGD) WITH PROPOFOL N/A 10/07/2015   Dr.Rourk- esophageal mucosa changes, dilated, small hiatal hernia, the examination was o/w normal bx= benign squamous mucosa with minimal reactive changes  . HEMORRHOID BANDING  10-Oct-2015   Dr.Rourk  . INGUINAL HERNIA REPAIR Left 04/21/2017   Procedure: LEFT INGUINAL HERNIORRHAPHY WITH MESH;  Surgeon: Aviva Signs, MD;  Location: [facility];  Service: General;  Laterality: Left;  . KNEE ARTHROSCOPY Left   . MALONEY DILATION N/A 10/07/2015   Procedure: Venia Minks DILATION;  Surgeon: Daneil Dolin, MD;  [facility];  Service: Endoscopy;  Laterality: N/A;  . MANDIBLE FRACTURE SURGERY     wired shut after a fall  . neck fusion    . POLYPECTOMY  09/18/2019   Procedure: POLYPECTOMY;  Surgeon: Daneil Dolin, MD;  [facility];  Service: Endoscopy;;    Family History  Problem Relation Age of Onset  . [name] Mother   . Hypertension Mother   . Colon cancer Mother 13       Passed away 2010-10-10 from stage IV CRC  . Lung cancer Father   . Liver disease Neg Hx   . Celiac disease Neg Hx     Social History   Socioeconomic History  . Marital status: Divorced    Spouse name: Not on file  . Number of children: Not on file  . Years of education: Not on file  . Highest education level: Not on file  Occupational History  . Not on file  Tobacco Use  . Smoking status: Never Smoker  . Smokeless tobacco: Never Used  . Tobacco comment: Never smoked  Vaping Use  . Vaping Use: Never used  Substance and Sexual Activity  . Alcohol use: No    Alcohol/week: 0.0 standard drinks    Comment: no prior heavy use  . Drug use: No  . Sexual  activity: Never    Birth control/protection: None  Other Topics Concern  . Not on file  Social History Narrative  . Not on file   Social Determinants of Health   Financial Resource Strain: Not on file  Food Insecurity: Not on file  Transportation Needs: Not on file  Physical Activity: Not on file  Stress: Not on file  Social Connections: Not on file  Intimate Partner Violence: Not on file    ROS:  General: Negative for anorexia, weight loss, fever, chills, fatigue, weakness. Eyes: Negative for vision changes.  ENT: Negative for hoarseness, difficulty swallowing , nasal congestion. CV: Negative for chest pain, angina, palpitations,  dyspnea on exertion, peripheral edema.  Respiratory: Negative for dyspnea at rest, dyspnea on exertion, cough, sputum, wheezing.  GI: See history of present illness. GU:  Negative for dysuria, hematuria, urinary incontinence, urinary frequency, nocturnal urination.  MS: +++ chronic joint pain, low back pain, neck pain.  Derm: Negative for rash or itching.  Neuro: Negative for weakness, abnormal sensation, seizure, frequent headaches, memory loss, confusion.  Psych: Negative for anxiety, depression, suicidal ideation, hallucinations.  Endo: Negative for unusual weight change.  Heme: Negative for bruising or bleeding. Allergy: Negative for rash or hives.   Observations/Objective: Pleasant, oriented, no distress. Otherwise exam unavailable.   See hpi for labs.  Assessment and Plan: Pleasant 55 y/o male with history of fatty liver, GERD presenting for follow up visit.   Fatty liver: [facility] chest [facility] with upper liver border slightly nodular.  Ultrasound with elastography in December 2020 showed diffuse hepatic steatosis but no overt changes of cirrhosis. His kPA score was good and in the setting of nonalcoholic fatty liver disease would indicate no to very little fibrosis. Recent repeat U/S with elastography showing very low likelihood of  fibrosis/advanced liver disease. LFTs have been normal.  Patient reports his weight to be between 303 110 pounds.  1 year ago he weighed in at 292 pounds per epic.  Physical activity limited by chronic pain.   GERD: Well-controlled on pantoprazole.  Constipation: New symptom with change in pain medication.  Patient reports 20-year history of pain medication use, in the past if he ran out he would actually have diarrhea.  Having Bristol 1 stools frequently.  BMs every other day.  Patient apprehensive about starting medication.  Encouraged increasing dietary fiber.  He wants to start back on black coffee to see if this will help his bowel function.  He can use MiraLAX 1 capful to 2 capfuls daily as needed.  If he prefers prescription medication for management of his BMs, he will call.  Colonoscopy up-to-date.  1. For constipation, increase dietary fiber.  Can add MiraLAX 17 g 1-2 times daily as needed.  Call if further recommendations needed i.e. prescription options. 2. Continue pantoprazole 40 mg daily for reflux. 3. Continue serial LFTs at least twice per year.  Frequently has labs through his pain management clinic.  He will have them forwarded to [place] as available. 4. Encouraged physical activity as tolerated.  Continue to strive for modest weight loss. 5. Return office visit in one year or call sooner if needed.   Follow Up Instructions:  I discussed the assessment and treatment plan with the patient. The patient was provided an opportunity to ask questions and all were answered. The patient agreed with the plan and demonstrated an understanding of the instructions. AVS mailed to patient's home address.   The patient was advised to call back or seek an in-person evaluation if the symptoms worsen or if the condition fails to improve as anticipated.  I provided 20 minutes of non-face-to-face time during this encounter.   Neil Crouch, PA-C

## 2021-03-05 IMAGING — MR MR CERVICAL SPINE W/O CM
5 series · 28 of 48 positions shown · non-contrast
Comparison: 07/16/2006

CLINICAL DATA: Chronic pain in the neck, arms, head, back, and
legs. Remote cervical spine fusion.

EXAM:
MRI CERVICAL SPINE WITHOUT CONTRAST
TECHNIQUE: Multiplanar, multisequence MR imaging of the cervical spine was
performed. No intravenous contrast was administered.

[Series 2: T2 · sagittal · 3.0mm · 0.66mm/px · 6 of 13 slices shown (1 of 2)]
[im 1/13]
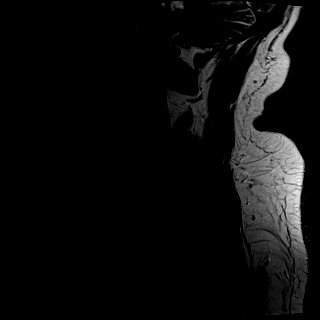
[im 3/13]
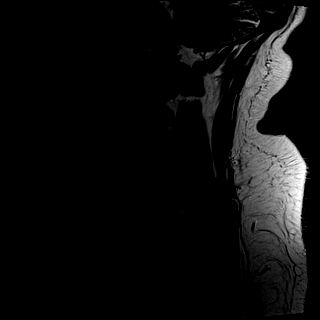
[im 5/13]
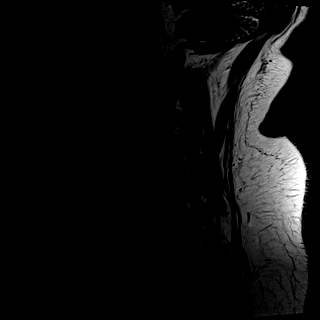
[im 8/13]
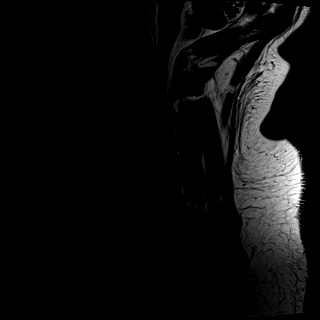
[im 10/13]
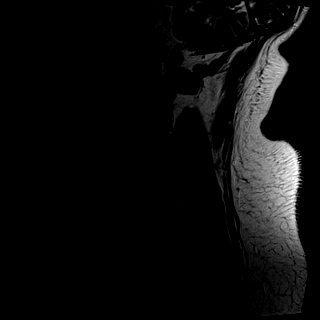
[im 13/13]
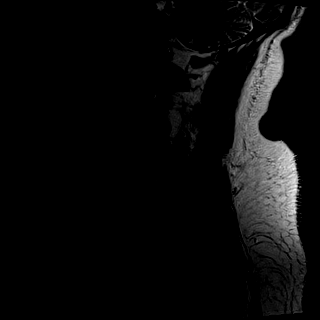

[Series 3: T1 · sagittal · 3.0mm · 0.41mm/px · 6 of 13 slices shown]
[im 1/13]
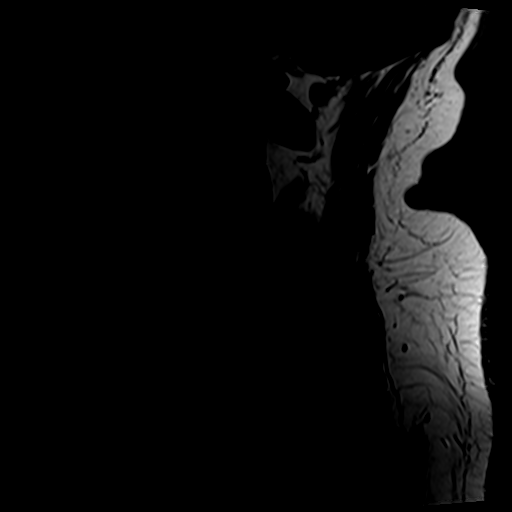
[im 3/13]
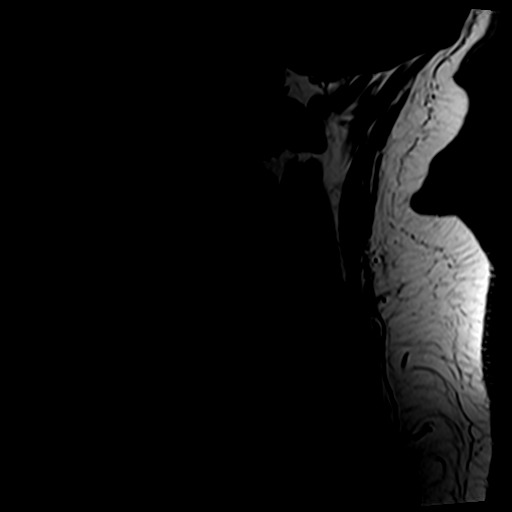
[im 5/13]
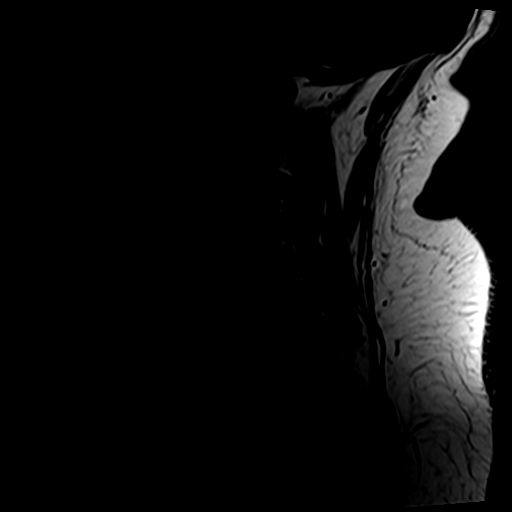
[im 8/13]
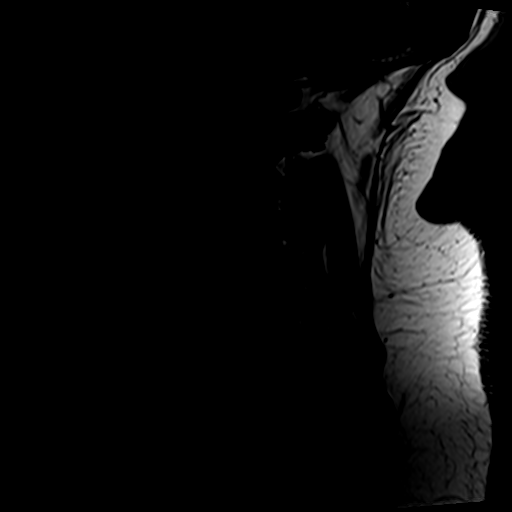
[im 10/13]
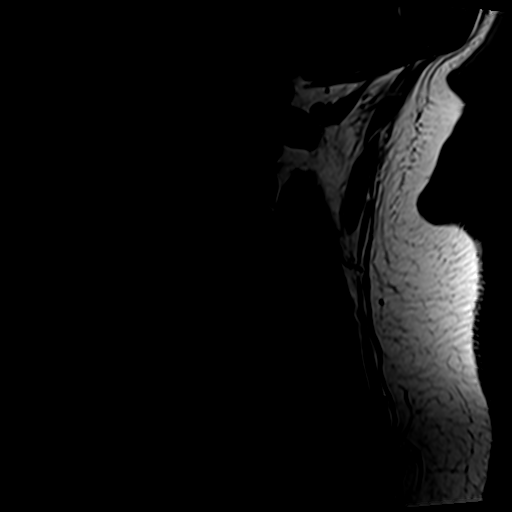
[im 13/13]
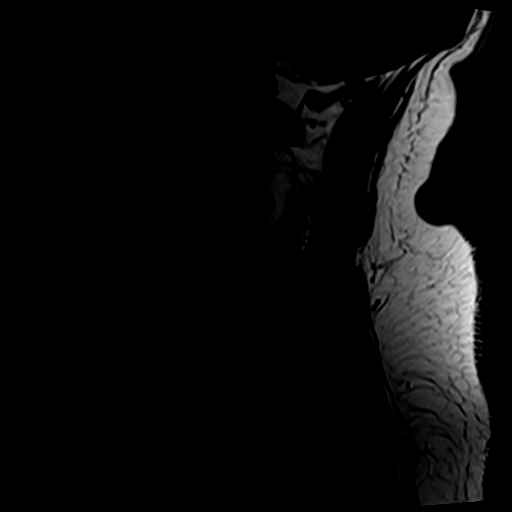

[Series 4: tir sag · sagittal · 3.0mm · 0.41mm/px · 6 of 13 slices shown]
[im 1/13]
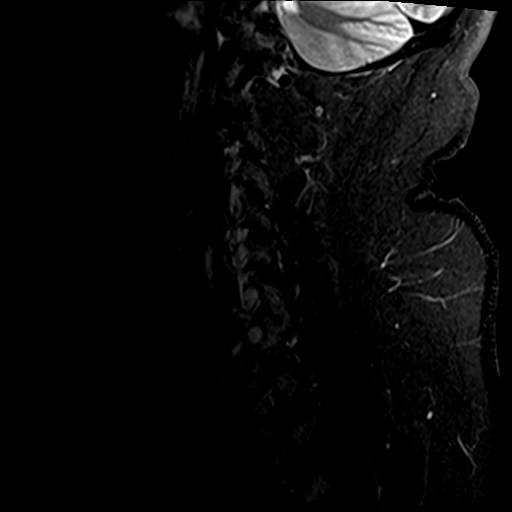
[im 3/13]
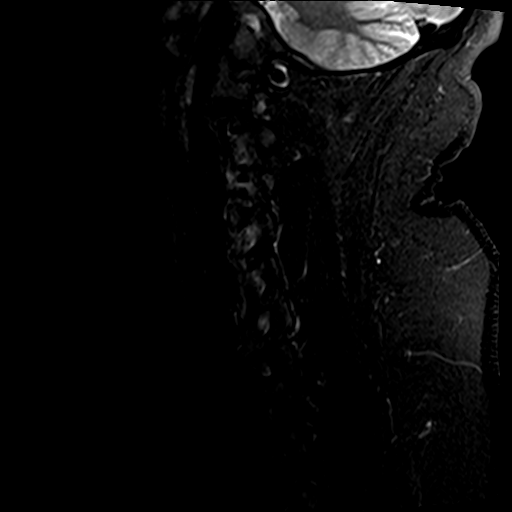
[im 5/13]
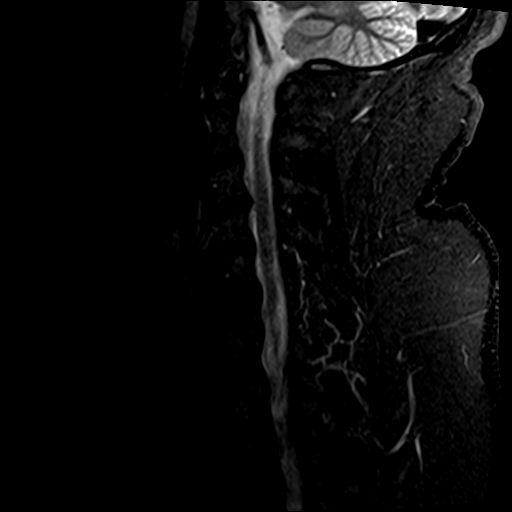
[im 8/13]
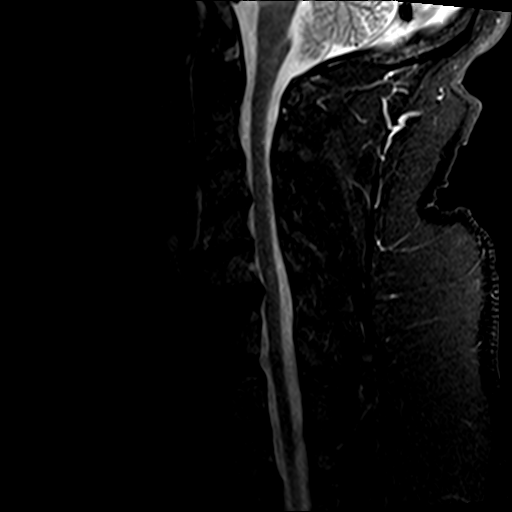
[im 10/13]
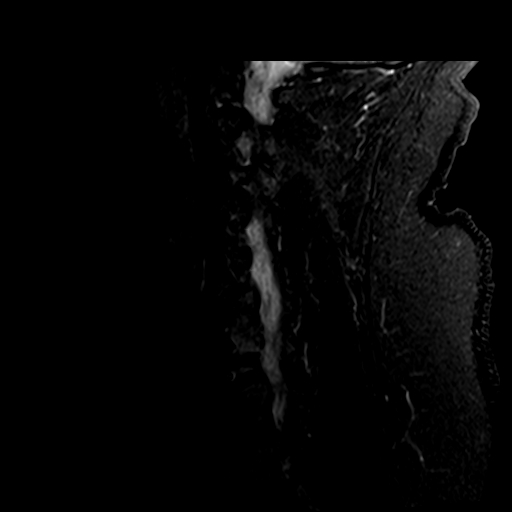
[im 13/13]
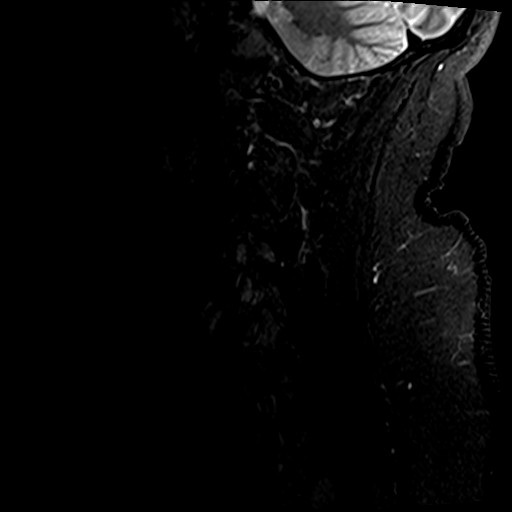

[Series 5: GRE · axial · 3.0mm · 0.35mm/px · 1 of 30 slices shown]
[im 1/30]
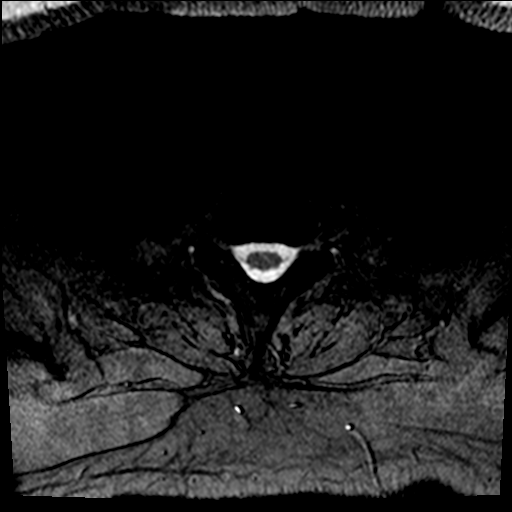

[Series 6: T2 · axial · 3.0mm · 0.70mm/px · z∈[-81,+27]mm · 9 of 30 slices shown (2 of 2)]
[im 1/30]
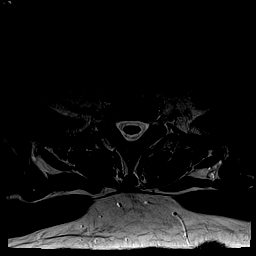
[im 5/30]
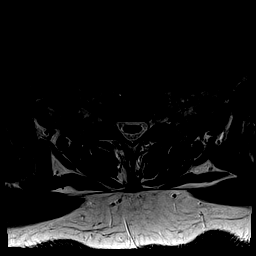
[im 9/30]
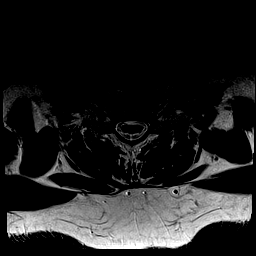
[im 13/30]
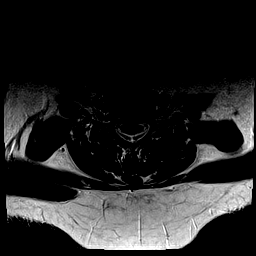
[im 15/30]
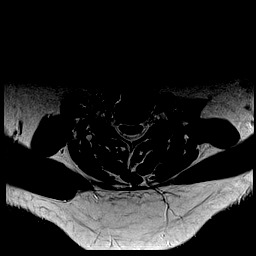
[im 17/30]
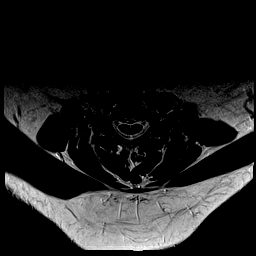
[im 21/30]
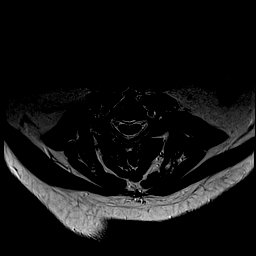
[im 25/30]
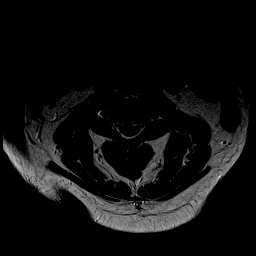
[im 30/30]
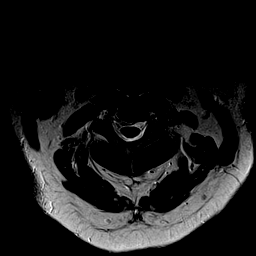

[28 of 48 positions shown; findings below may reference images not displayed]

FINDINGS: Alignment: Chronic cervical spine straightening.  No listhesis.

Vertebrae: No fracture, suspicious osseous lesion, or significant
marrow edema. Remote, solid C6-7 ACDF.

Cord: Normal signal.

Posterior Fossa, vertebral arteries, paraspinal tissues:
Unremarkable.

Disc levels:

C2-3: A new central disc protrusion result in mild spinal stenosis.
Patent neural foramina. Mild left facet arthrosis.

C3-4: A broad-based posterior disc osteophyte complex and more focal
central disc protrusion result in mild to moderate spinal stenosis
with mild cord flattening and mild-to-moderate bilateral neural
foraminal stenosis, overall mildly progressed from prior.

C4-5: Disc bulging results in borderline spinal stenosis without
neural foraminal stenosis, unchanged.

C5-6: A shallow central to left paracentral disc protrusion results
in mild ventral cord flattening without significant stenosis,
unchanged.

C6-7: ACDF.  No stenosis.

C7-T1: Mild facet arthrosis without disc herniation or stenosis.
IMPRESSION: 1. Mild-to-moderate spinal and neural foraminal stenosis at C3-4,
mildly progressed from 4551.
2. New central disc protrusion at C2-3 with mild spinal stenosis.
3. Remote C6-7 ACDF without stenosis.

## 2021-04-17 ENCOUNTER — Emergency Department (HOSPITAL_COMMUNITY)
Admission: EM | Admit: 2021-04-17 | Discharge: 2021-04-17 | Disposition: A | Discharge status: Home / Self Care | Payer: Medicare HMO | Attending: Emergency Medicine | Admitting: Emergency Medicine

## 2021-04-17 DIAGNOSIS — R200 Anesthesia of skin: Secondary | ICD-10-CM | POA: Insufficient documentation

## 2021-04-17 DIAGNOSIS — Z5321 Procedure and treatment not carried out due to patient leaving prior to being seen by health care provider: Secondary | ICD-10-CM | POA: Insufficient documentation

## 2021-04-17 DIAGNOSIS — R55 Syncope and collapse: Secondary | ICD-10-CM | POA: Insufficient documentation

## 2021-04-17 LAB — CBC
HCT: 46.1 % (ref 39.0–52.0)
Hemoglobin: 15.3 g/dL (ref 13.0–17.0)
MCH: 30.2 pg (ref 26.0–34.0)
MCHC: 33.2 g/dL (ref 30.0–36.0)
MCV: 90.9 fL (ref 80.0–100.0)
Platelets: 245 10*3/uL (ref 150–400)
RBC: 5.07 MIL/uL (ref 4.22–5.81)
RDW: 13 % (ref 11.5–15.5)
WBC: 10.1 10*3/uL (ref 4.0–10.5)
nRBC: 0 % (ref 0.0–0.2)

## 2021-04-17 LAB — BASIC METABOLIC PANEL
Anion gap: 8 (ref 5–15)
BUN: 10 mg/dL (ref 6–20)
CO2: 27 mmol/L (ref 22–32)
Calcium: 9.2 mg/dL (ref 8.9–10.3)
Chloride: 100 mmol/L (ref 98–111)
Creatinine, Ser: 0.95 mg/dL (ref 0.61–1.24)
GFR, Estimated: >60 mL/min (ref >60)
Glucose, Bld: 109 mg/dL — ABNORMAL HIGH (ref 70–99)
Potassium: 3.9 mmol/L (ref 3.5–5.1)
Sodium: 135 mmol/L (ref 135–145)

## 2021-04-17 NOTE — ED Triage Notes (Signed)
Felt like he was going to pass out about and hour ago

## 2021-07-04 ENCOUNTER — Ambulatory Visit: Payer: Medicare HMO | Admitting: Internal Medicine

## 2021-07-04 ENCOUNTER — Telehealth: Payer: Self-pay | Admitting: Internal Medicine

## 2021-07-04 NOTE — Telephone Encounter (Signed)
Pt called to reschedule his OV from today and is aware of his new appointment in May. He is on the cancellation list. He has questions about his probiotics and pain medication he is on. Please call [phone]

## 2021-07-04 NOTE — Telephone Encounter (Signed)
Pt states that he is having a lot of bloating. Pt states that he is on a lot of pain medications that may be causing it. Pt is wanting to know if taking a probiotic would help with this. Pt is not having any constipation or diarrhea. No blood in stools. No nausea or vomiting. Pt has a bm every morning.

## 2021-07-08 NOTE — Telephone Encounter (Signed)
Pt was made aware and verbalized understanding.  

## 2021-07-08 NOTE — Telephone Encounter (Signed)
He could try a good probiotic daily for four weeks. Sh[employer], [employer] are good options. If no improvement, he should let us know.

## 2021-08-26 ENCOUNTER — Ambulatory Visit: Payer: Medicare HMO | Admitting: Endocrinology

## 2021-10-26 NOTE — Progress Notes (Deleted)
GI Office Note    Referring Provider: Deloria Lair., MD Primary Care Physician:  Deloria Lair., MD  Primary Gastroenterologist: Garfield Cornea, MD   Chief Complaint   No chief complaint on file.   History of Present Illness   Craig Drake is a 56 y.o. male presenting today for follow up. Last visit via video visit in 07/2020. He has h/o fatty liver, GERD. H/o abnormal LFTs suspsected to be secondary to NASH with thorough evaluation (negative autoimmune, AMA negative, celiac screen negative, negative iron overload, Immune to Hep A and completed Hep B vaccinations). Little to no fibrosis Dec 2020 on [place] with elastography, repeat 04/2020 with Median kPa: 2.2 (high probability of being normal). FH of colon cancer and personal history of polyps with surveillance colonoscopy due in 2026. At last ov complained of constipation.         Medications   Current Outpatient Medications  Medication Sig Dispense Refill   allopurinol (ZYLOPRIM) 100 MG tablet Take 200 mg by mouth daily.      amLODipine (NORVASC) 5 MG tablet Take 5 mg by mouth daily.     aspirin 81 MG EC tablet Take 81 mg by mouth daily.      colchicine 0.6 MG tablet Take 0.6 mg by mouth daily as needed (gout).     levothyroxine (SYNTHROID, LEVOTHROID) 100 MCG tablet Take 100 mcg by mouth daily before breakfast.     lisinopril (PRINIVIL,ZESTRIL) 20 MG tablet Take 20 mg by mouth daily.  1   Morphine Sulfate ER 15 MG T12A Take 15 mg by mouth in the morning and at bedtime.      oxyCODONE ER (XTAMPZA ER) 13.5 MG C12A Take 13.5 mg by mouth in the morning and at bedtime.     pantoprazole (PROTONIX) 40 MG tablet Take 40 mg by mouth daily before breakfast.      tiZANidine (ZANAFLEX) 2 MG tablet Take 2 mg by mouth every 6 (six) hours as needed for muscle spasms.      No current facility-administered medications for this visit.    Allergies   Allergies as of 10/27/2021 - Review Complete 04/17/2021  Allergen Reaction Noted    Ibuprofen  09/12/2015   Lyrica [pregabalin] Swelling 01/11/2012     Past Medical History   Past Medical History:  Diagnosis Date   Anxiety    and atypical chest pain   Chronic back pain    DDD (degenerative disc disease)    Depression    GERD (gastroesophageal reflux disease)    Gout    Hemorrhoids    History of kidney stones    Hypercholesteremia    Hypothyroidism    Labile hypertension    Multilevel degenerative disc disease    Sleep apnea    does not wear CPAP    Past Surgical History   Past Surgical History:  Procedure Laterality Date   BIOPSY  10/07/2015   Procedure: BIOPSY;  Surgeon: Daneil Dolin, MD;  [facility];  Service: Endoscopy;;  esophageal bx;    COLONOSCOPY WITH PROPOFOL N/A 10/07/2015   Dr.Rourk- diverticulosis in the entire examined colon, internal hemorrhoids, the examination was o/w normal   COLONOSCOPY WITH PROPOFOL N/A 09/18/2019    pancolonic diverticulosis, two 3-8 mm polyps at IC valve s/p removal. Tubular adenomas. Surveillance 5 years.    ESOPHAGOGASTRODUODENOSCOPY (EGD) WITH PROPOFOL N/A 10/07/2015   Dr.Rourk- esophageal mucosa changes, dilated, small hiatal hernia, the examination was o/w normal bx= benign  squamous mucosa with minimal reactive changes   HEMORRHOID BANDING  10/03/15   Dr.Rourk   INGUINAL HERNIA REPAIR Left 04/21/2017   Procedure: LEFT INGUINAL HERNIORRHAPHY WITH MESH;  Surgeon: Aviva Signs, MD;  Location: [facility];  Service: General;  Laterality: Left;   KNEE ARTHROSCOPY Left    MALONEY DILATION N/A 10/07/2015   Procedure: Venia Minks DILATION;  Surgeon: Daneil Dolin, MD;  [facility];  Service: Endoscopy;  Laterality: N/A;   MANDIBLE FRACTURE SURGERY     wired shut after a fall   neck fusion     POLYPECTOMY  09/18/2019   Procedure: POLYPECTOMY;  Surgeon: Daneil Dolin, MD;  [facility];  Service: Endoscopy;;    Past Family History   Family History  Problem Relation Age of Onset    [name] Mother    Hypertension Mother    Colon cancer Mother 47       Passed away 10-03-10 from stage IV CRC   Lung cancer Father    Liver disease Neg Hx    Celiac disease Neg Hx     Past Social History   Social History   Socioeconomic History   Marital status: Divorced    Spouse name: Not on file   Number of children: Not on file   Years of education: Not on file   Highest education level: Not on file  Occupational History   Not on file  Tobacco Use   Smoking status: Never   Smokeless tobacco: Never   Tobacco comments:    Never smoked  Vaping Use   Vaping Use: Never used  Substance and Sexual Activity   Alcohol use: No    Alcohol/week: 0.0 standard drinks    Comment: no prior heavy use   Drug use: No   Sexual activity: Never    Birth control/protection: None  Other Topics Concern   Not on file  Social History Narrative   Not on file   Social Determinants of Health   Financial Resource Strain: Not on file  Food Insecurity: Not on file  Transportation Needs: Not on file  Physical Activity: Not on file  Stress: Not on file  Social Connections: Not on file  Intimate Partner Violence: Not on file    Review of Systems   General: Negative for anorexia, weight loss, fever, chills, fatigue, weakness. ENT: Negative for hoarseness, difficulty swallowing , nasal congestion. CV: Negative for chest pain, angina, palpitations, dyspnea on exertion, peripheral edema.  Respiratory: Negative for dyspnea at rest, dyspnea on exertion, cough, sputum, wheezing.  GI: See history of present illness. GU:  Negative for dysuria, hematuria, urinary incontinence, urinary frequency, nocturnal urination.  Endo: Negative for unusual weight change.     Physical Exam   There were no vitals taken for this visit.   General: Well-nourished, well-developed in no acute distress.  Eyes: No icterus. Mouth: Oropharyngeal mucosa moist and pink , no lesions erythema or exudate. Lungs: Clear to  auscultation bilaterally.  Heart: Regular rate and rhythm, no murmurs rubs or gallops.  Abdomen: Bowel sounds are normal, nontender, nondistended, no hepatosplenomegaly or masses,  no abdominal bruits or hernia , no rebound or guarding.  Rectal: not performed  Extremities: No lower extremity edema. No clubbing or deformities. Neuro: Alert and oriented x 4   Skin: Warm and dry, no jaundice.   Psych: Alert and cooperative, normal mood and affect.  Labs   Lab Results  Component Value Date   CREATININE 0.95 04/17/2021  BUN 10 04/17/2021   NA 135 04/17/2021   K 3.9 04/17/2021   CL 100 04/17/2021   CO2 27 04/17/2021   Lab Results  Component Value Date   WBC 10.1 04/17/2021   HGB 15.3 04/17/2021   HCT 46.1 04/17/2021   MCV 90.9 04/17/2021   PLT 245 04/17/2021   Lab Results  Component Value Date   ALT 17 09/15/2019   AST QUANTITY NOT SUFFICIENT, UNABLE TO PERFORM TEST 09/15/2019   ALKPHOS 103 09/15/2019   BILITOT 0.1 (L) 09/15/2019    Imaging Studies   No results found.  Assessment       PLAN   ***   Laureen Ochs. Bobby Rumpf, [place], [facility]

## 2021-10-27 ENCOUNTER — Ambulatory Visit: Payer: Medicare HMO | Admitting: Gastroenterology

## 2021-12-31 ENCOUNTER — Ambulatory Visit: Payer: Medicare HMO | Admitting: Gastroenterology

## 2022-08-19 ENCOUNTER — Telehealth: Payer: Self-pay | Admitting: Gastroenterology

## 2022-08-19 NOTE — Telephone Encounter (Signed)
Patient called to find out if he should schedule an appointment or wait until time to have his colonoscopy.  He said that he has had a lot going on and has lost all of his medical records.  He said that he was losing weight, trying to eat healthy and hasn't had any blood in his stools.  Please advise.

## 2022-08-19 NOTE — Telephone Encounter (Signed)
If the patient is having issues that he wants to address, he needs an appt. He has not been seen since 2022.

## 2022-10-12 ENCOUNTER — Encounter: Payer: Self-pay | Admitting: Physician Assistant

## 2022-10-12 ENCOUNTER — Institutional Professional Consult (permissible substitution) (INDEPENDENT_AMBULATORY_CARE_PROVIDER_SITE_OTHER): Payer: Medicare HMO | Admitting: Physician Assistant

## 2022-10-12 VITALS — BP 132/91 | HR 65 | Resp 20 | Ht 74.0 in | Wt 271.0 lb

## 2022-10-12 DIAGNOSIS — I7121 Aneurysm of the ascending aorta, without rupture: Secondary | ICD-10-CM

## 2022-10-12 NOTE — Progress Notes (Signed)
[address]       [name] [address]             [phone]      PCP is Margo Common Oley Balm., MD Referring Provider is Toma Deiters, MD     HPI: [name] is a 57 year old male with past medical history notable for hypertension, dyslipidemia, obesity, gastroesophageal reflux disease, chronic pain syndrome with lumbar radiculopathy and who is a lifelong non-smoker.  He was seen in the emergency room at [facility] about 6 weeks ago for complaint of dizziness and shortness of breath.  Workup included a CT of the chest to rule out pulmonary embolus.  This study noted a 4.0 cm ascending aortic aneurysm along with a 5 x 6 mm right upper lobe nodule that was stable when compared to previous studies.  [name] was referred to CT surgery for evaluation and ongoing surveillance of the thoracic aortic aneurysm. He has a positive family history for abdominal aortic aneurysmal disease in his father diagnosed late in life and repaired with a stent graft.  He lived to age 5.  Past Medical History:  Diagnosis Date   Anxiety    and atypical chest pain   Chronic back pain    DDD (degenerative disc disease)    Depression    GERD (gastroesophageal reflux disease)    Gout    Hemorrhoids    History of kidney stones    Hypercholesteremia    Hypothyroidism    Labile hypertension    Multilevel degenerative disc disease    Sleep apnea    does not wear CPAP    Past Surgical History:  Procedure Laterality Date   BIOPSY  10/07/2015   Procedure: BIOPSY;  Surgeon: Corbin Ade, MD;  Location: [facility];  Service: Endoscopy;;  esophageal bx;    COLONOSCOPY WITH PROPOFOL N/A 10/07/2015   Dr.Rourk- diverticulosis in the entire examined colon, internal hemorrhoids, the examination was o/w normal   COLONOSCOPY WITH PROPOFOL N/A 09/18/2019    pancolonic diverticulosis, two 3-8 mm polyps at IC valve s/p removal. Tubular adenomas. Surveillance 5 years.     ESOPHAGOGASTRODUODENOSCOPY (EGD) WITH PROPOFOL N/A 10/07/2015   Dr.Rourk- esophageal mucosa changes, dilated, small hiatal hernia, the examination was o/w normal bx= benign squamous mucosa with minimal reactive changes   HEMORRHOID BANDING  2015-10-29   Dr.Rourk   INGUINAL HERNIA REPAIR Left 04/21/2017   Procedure: LEFT INGUINAL HERNIORRHAPHY WITH MESH;  Surgeon: Franky Macho, MD;  Location: [facility];  Service: General;  Laterality: Left;   KNEE ARTHROSCOPY Left    MALONEY DILATION N/A 10/07/2015   Procedure: Elease Hashimoto DILATION;  Surgeon: Corbin Ade, MD;  Location: [facility];  Service: Endoscopy;  Laterality: N/A;   MANDIBLE FRACTURE SURGERY     wired shut after a fall   neck fusion     POLYPECTOMY  09/18/2019   Procedure: POLYPECTOMY;  Surgeon: Corbin Ade, MD;  Location: [facility];  Service: Endoscopy;;    Family History  Problem Relation Age of Onset   [name] Mother    Hypertension Mother    Colon cancer Mother 85       Passed away 29-Oct-2010 from stage IV CRC   Lung cancer Father    Liver disease Neg Hx    Celiac disease Neg Hx     Social History Social History   Tobacco Use   Smoking status: Never   Smokeless tobacco:  Never   Tobacco comments:    Never smoked  Vaping Use   Vaping Use: Never used  Substance Use Topics   Alcohol use: No    Alcohol/week: 0.0 standard drinks of alcohol    Comment: no prior heavy use   Drug use: No    Current Outpatient Medications  Medication Sig Dispense Refill   allopurinol (ZYLOPRIM) 100 MG tablet Take 200 mg by mouth daily.      amLODipine (NORVASC) 5 MG tablet Take 5 mg by mouth daily.     aspirin 81 MG EC tablet Take 81 mg by mouth daily.      colchicine 0.6 MG tablet Take 0.6 mg by mouth daily as needed (gout).     levothyroxine (SYNTHROID, LEVOTHROID) 100 MCG tablet Take 100 mcg by mouth daily before breakfast.     lisinopril (PRINIVIL,ZESTRIL) 20 MG tablet Take 20 mg by mouth daily.  1   Morphine Sulfate ER 15 MG T12A Take  15 mg by mouth in the morning and at bedtime.      oxyCODONE ER (XTAMPZA ER) 13.5 MG C12A Take 13.5 mg by mouth in the morning and at bedtime.     pantoprazole (PROTONIX) 40 MG tablet Take 40 mg by mouth daily before breakfast.      tiZANidine (ZANAFLEX) 2 MG tablet Take 2 mg by mouth every 6 (six) hours as needed for muscle spasms.      No current facility-administered medications for this visit.    Allergies  Allergen Reactions   Ibuprofen    Lyrica [Pregabalin] Swelling    Review of Systems: Review of Systems  Constitutional:  Positive for weight loss.       Lost 45lb over 18 months with diet changes  Cardiovascular:  Positive for chest pain.  Musculoskeletal:  Positive for joint pain and neck pain.      Physical Exam: Vital signs BP 132/91 Pulse 65 Respirations 20 SpO2 96% on room air  General: [name] is a pleasant 57 year old male who who appears his stated age.  He is mildly anxious and admits that he has 7 out of 10 pain in his neck and shoulder which is chronic. Heart: Regular rate and rhythm without murmur Chest: Breath sounds are full, equal, and clear to auscultation Abdomen: Soft, nontender, normal bowel sounds Extremities: No obvious deformity, well-perfused with palpable distal pulses. Neuro: Grossly nonfocal  Diagnostic Tests: CTA Chest W Contrast  Anatomical Region Laterality Modality  Chest -- Computed Tomography  Vascular -- --   Impression  1. No pulmonary embolus with slightly limited evaluation distally due to timing of contrast. 2. A right upper lobe 5.5 mm pulmonary nodule that likely correlates with previously noted nodule in chest report 10/30/2019. Correlation with prior cross-sectional imaging would be of value. If not available: Non-contrast chest CT at 6-12 months is recommended. If the nodule is stable at time of repeat CT, then future CT at 18-24 months (from today's scan) is considered optional for low-risk patients, but is  recommended for high-risk patients. This recommendation follows the consensus statement: Guidelines for Management of Incidental Pulmonary Nodules Detected on CT Images: From the Fleischner Society 2017; Radiology 2017; 284:228-243. 3. Aneurysmal ascending thoracic aorta (4 cm). Non-contrast chest CT at 6-12 months is recommended. If the nodule is stable at time of repeat CT, then future CT at 18-24 months (from today's scan) is considered optional for low-risk patients, but is recommended for high-risk patients. This recommendation follows the consensus statement: Guidelines for  Management of Incidental Pulmonary Nodules Detected on CT Images: From the Fleischner Society 2017; Radiology 2017; 284:228-243. 4. Aortic Atherosclerosis (ICD10-I70.0) including 2 vessel coronary calcification.   Electronically Signed   By: Tish Frederickson M.D.   On: 09/03/2022 23:23 Narrative  CLINICAL DATA:  elev d-dimer SOB Table formatting from the original note was not included.. Dizziness and sob times 1 month.  EXAM: CT ANGIOGRAPHY CHEST WITH CONTRAST  TECHNIQUE: Multidetector CT imaging of the chest was performed using the standard protocol during bolus administration of intravenous contrast. Multiplanar CT image reconstructions and MIPs were obtained to evaluate the vascular anatomy.  RADIATION DOSE REDUCTION: This exam was performed according to the departmental dose-optimization program which includes automated exposure control, adjustment of the mA and/or kV according to patient size and/or use of iterative reconstruction technique.  CONTRAST:  100 ml of omnipaque 350  COMPARISON:  Chest x-ray 09/04/2022, CT chest 10/30/2019 report without imaging  FINDINGS: Cardiovascular: Fair opacification of the pulmonary arteries to the segmental level. No evidence of pulmonary embolism. The main pulmonary artery is normal in caliber. Normal heart size. No significant pericardial effusion. The  ascending thoracic aorta is enlarged measuring up to 4 cm. The descending thoracic aorta is normal in caliber.. No dissection of the thoracic aorta. No atherosclerotic plaque of the thoracic aorta. At least 3 vessel coronary artery calcifications.  Mediastinum/Nodes: No enlarged mediastinal, hilar, or axillary lymph nodes. Thyroid gland, trachea, and esophagus demonstrate no significant findings.  Lungs/Pleura: No focal consolidation. Right upper lobe 5 x 6 mm pulmonary nodule. No new pulmonary nodule.Marland Kitchen No pulmonary mass. No pleural effusion. No pneumothorax.  Upper Abdomen: Colonic diverticulosis.  No acute abnormality.  Musculoskeletal:  No chest wall abnormality.  Bilateral gynecomastia.  No suspicious lytic or blastic osseous lesions. No acute displaced fracture. Cervical spine surgical hardware partially visualized.  Review of the MIP images confirms the above findings. Procedure Note  Rodrigo Ran, MD - 09/03/2022 Formatting of this note might be different from the original. CLINICAL DATA:  elev d-dimer SOB Table formatting from the original note was not included.. Dizziness and sob times 1 month.  EXAM: CT ANGIOGRAPHY CHEST WITH CONTRAST  TECHNIQUE: Multidetector CT imaging of the chest was performed using the standard protocol during bolus administration of intravenous contrast. Multiplanar CT image reconstructions and MIPs were obtained to evaluate the vascular anatomy.  RADIATION DOSE REDUCTION: This exam was performed according to the departmental dose-optimization program which includes automated exposure control, adjustment of the mA and/or kV according to patient size and/or use of iterative reconstruction technique.  CONTRAST:  100 ml of omnipaque 350  COMPARISON:  Chest x-ray 09/04/2022, CT chest 10/30/2019 report without imaging  FINDINGS: Cardiovascular: Fair opacification of the pulmonary arteries to the segmental level. No evidence  of pulmonary embolism. The main pulmonary artery is normal in caliber. Normal heart size. No significant pericardial effusion. The ascending thoracic aorta is enlarged measuring up to 4 cm. The descending thoracic aorta is normal in caliber.. No dissection of the thoracic aorta. No atherosclerotic plaque of the thoracic aorta. At least 3 vessel coronary artery calcifications.  Mediastinum/Nodes: No enlarged mediastinal, hilar, or axillary lymph nodes. Thyroid gland, trachea, and esophagus demonstrate no significant findings.  Lungs/Pleura: No focal consolidation. Right upper lobe 5 x 6 mm pulmonary nodule. No new pulmonary nodule.Marland Kitchen No pulmonary mass. No pleural effusion. No pneumothorax.  Upper Abdomen: Colonic diverticulosis.  No acute abnormality.  Musculoskeletal:  No chest wall abnormality.  Bilateral gynecomastia.  No suspicious  lytic or blastic osseous lesions. No acute displaced fracture. Cervical spine surgical hardware partially visualized.  Review of the MIP images confirms the above findings.  IMPRESSION: 1. No pulmonary embolus with slightly limited evaluation distally due to timing of contrast. 2. A right upper lobe 5.5 mm pulmonary nodule that likely correlates with previously noted nodule in chest report 10/30/2019. Correlation with prior cross-sectional imaging would be of value. If not available: Non-contrast chest CT at 6-12 months is recommended. If the nodule is stable at time of repeat CT, then future CT at 18-24 months (from today's scan) is considered optional for low-risk patients, but is recommended for high-risk patients. This recommendation follows the consensus statement: Guidelines for Management of Incidental Pulmonary Nodules Detected on CT Images: From the Fleischner Society 2017; Radiology 2017; 284:228-243. 3. Aneurysmal ascending thoracic aorta (4 cm). Non-contrast chest CT at 6-12 months is recommended. If the nodule is stable at time  of repeat CT, then future CT at 18-24 months (from today's scan) is considered optional for low-risk patients, but is recommended for high-risk patients. This recommendation follows the consensus statement: Guidelines for Management of Incidental Pulmonary Nodules Detected on CT Images: From the Fleischner Society 2017; Radiology 2017; 284:228-243. 4. Aortic Atherosclerosis (ICD10-I70.0) including 2 vessel coronary calcification.   Electronically Signed   By: Tish Frederickson M.D.   On: 09/03/2022 23:23 Exam End: 09/03/22 22:45   Specimen Collected: 09/03/22 23:01 Last Resulted: 09/03/22 23:23  Received From: [facility]  Result Received: 09/14/22 14:53    Impression / Plan: Pleasant 57 year old male with past medical history of hypertension, dyslipidemia, obesity, gastroesophageal reflux disease, chronic pain syndrome with lumbar and neck radiculopathy and who is a lifelong non-smoker.  Recently evaluated for chest discomfort and shortness of breath and noted to have a 4.0 cm thoracic aortic aneurysm.  He also has a 5 x 6 mm right upper lobe pulmonary nodule that is stable per the radiology report when compared to a CT scan from 2021. This aneurysm does not require surgical intervention but we discussed the importance of managing risk factors and ongoing surveillance. Plan follow-up in 1 year with CTA chest for evaluation of the aneurysm and also to reevaluate the pulmonary RUL nodule. He is advised to carefully monitor and manage his blood pressure with a goal of keeping it less than 130/80.  I encouraged continued exercise and weight loss.  He has been prescribed atorvastatin in the past but decided not to take it after he reviewed the potential side effects.  I encouraged him to take the atorvastatin.  We discussed the importance of avoiding prolonged or repetitive strenuous activities that involve Valsalva maneuver.  We also discussed the importance of avoiding the fluoroquinolone  class of antibiotics.  Leary Roca, PA-C [facility] [phone]

## 2022-10-12 NOTE — Patient Instructions (Signed)
Recommend you take the Lipitor (atorvastatin) as prescribed to help minimize risk of expansion of the aortic aneurysm.  Avoid repetitive strenuous activities with no lifting greater than 40 pounds.  Avoid the fluoroquinolone class of antibiotics such as Cipro, Levaquin, and moxifloxacin.  Follow-up in 1 year with repeat CTA chest

## 2023-01-06 ENCOUNTER — Other Ambulatory Visit (HOSPITAL_COMMUNITY): Payer: Self-pay | Admitting: Physician Assistant

## 2023-01-06 DIAGNOSIS — G959 Disease of spinal cord, unspecified: Secondary | ICD-10-CM

## 2023-01-11 ENCOUNTER — Other Ambulatory Visit (HOSPITAL_COMMUNITY): Payer: Self-pay | Admitting: Physician Assistant

## 2023-01-11 DIAGNOSIS — G959 Disease of spinal cord, unspecified: Secondary | ICD-10-CM

## 2023-01-27 ENCOUNTER — Ambulatory Visit (HOSPITAL_COMMUNITY)
Admission: RE | Admit: 2023-01-27 | Discharge: 2023-01-27 | Disposition: A | Discharge status: Home / Self Care | Payer: Medicare HMO | Source: Ambulatory Visit | Attending: Physician Assistant | Admitting: Physician Assistant

## 2023-01-27 DIAGNOSIS — G959 Disease of spinal cord, unspecified: Secondary | ICD-10-CM | POA: Diagnosis not present

## 2023-02-10 DIAGNOSIS — I4891 Unspecified atrial fibrillation: Secondary | ICD-10-CM | POA: Insufficient documentation

## 2023-04-22 DIAGNOSIS — I25118 Atherosclerotic heart disease of native coronary artery with other forms of angina pectoris: Secondary | ICD-10-CM | POA: Insufficient documentation

## 2023-05-25 DIAGNOSIS — I7121 Aneurysm of the ascending aorta, without rupture: Secondary | ICD-10-CM | POA: Insufficient documentation

## 2023-08-24 ENCOUNTER — Other Ambulatory Visit

## 2023-08-24 DIAGNOSIS — F1120 Opioid dependence, uncomplicated: Secondary | ICD-10-CM | POA: Insufficient documentation

## 2023-08-27 ENCOUNTER — Other Ambulatory Visit: Payer: Self-pay | Admitting: Thoracic Surgery (Cardiothoracic Vascular Surgery)

## 2023-08-27 DIAGNOSIS — I7121 Aneurysm of the ascending aorta, without rupture: Secondary | ICD-10-CM

## 2023-10-12 ENCOUNTER — Encounter: Payer: Self-pay | Admitting: Thoracic Surgery (Cardiothoracic Vascular Surgery)

## 2023-10-12 ENCOUNTER — Other Ambulatory Visit

## 2023-10-12 ENCOUNTER — Ambulatory Visit: Admitting: Thoracic Surgery (Cardiothoracic Vascular Surgery)

## 2023-10-14 ENCOUNTER — Ambulatory Visit
Admission: RE | Admit: 2023-10-14 | Discharge: 2023-10-14 | Disposition: A | Discharge status: Home / Self Care | Source: Ambulatory Visit | Attending: Thoracic Surgery (Cardiothoracic Vascular Surgery) | Admitting: Thoracic Surgery (Cardiothoracic Vascular Surgery)

## 2023-10-14 DIAGNOSIS — I7121 Aneurysm of the ascending aorta, without rupture: Secondary | ICD-10-CM

## 2023-10-14 MED ORDER — IOPAMIDOL (ISOVUE-370) INJECTION 76%
75.0000 mL | Freq: Once | INTRAVENOUS | Status: AC | PRN
  Administered 2023-10-14: 75 mL via INTRAVENOUS

## 2023-10-19 ENCOUNTER — Encounter: Payer: Self-pay | Admitting: Thoracic Surgery (Cardiothoracic Vascular Surgery)

## 2023-10-19 ENCOUNTER — Ambulatory Visit
Attending: Thoracic Surgery (Cardiothoracic Vascular Surgery) | Admitting: Thoracic Surgery (Cardiothoracic Vascular Surgery)

## 2023-10-19 VITALS — BP 156/100 | HR 85 | Resp 18 | Ht 74.0 in | Wt 292.0 lb

## 2023-10-19 DIAGNOSIS — I7121 Aneurysm of the ascending aorta, without rupture: Secondary | ICD-10-CM | POA: Diagnosis not present

## 2023-10-19 NOTE — Progress Notes (Signed)
 [address]       [place] [address]             [phone]     HPI: Craig Drake referred for follow-up of his ascending aortic ectasia and right upper lobe lung nodule.  Craig Drake is a 58 year old man with a past history of hypertension, hyperlipidemia, obesity, reflux, chronic pain due to lumbar radiculopathy, ascending aortic ectasia, and a right upper lobe lung nodule.  He was seen in the emergency room at [facility] in 2024 with dizziness and shortness of breath.  The CT was done to rule out a pulmonary embolus.  They reported a 4 cm ascending aneurysm and a 5 x 6 cm right upper lobe lung nodule.  They were stable from previous studies.  Of note in his previous radiology reports there was no mention of an aneurysm.  He was seen by Parris Bolognese a year ago.  He now returns for a 1 year follow-up.  He had some issues with some numbness in his chest couple of months ago but nothing recent.  No chest pain, pressure, tightness, or shortness of breath.  Physical activity is limited by his chronic pain issues.  Says today his pain is a 6 out of 10 which is the lowest it has been in a while.  Past Medical History:  Diagnosis Date   Anxiety    and atypical chest pain   Chronic back pain    DDD (degenerative disc disease)    Depression    GERD (gastroesophageal reflux disease)    Gout    Hemorrhoids    History of kidney stones    Hypercholesteremia    Hypothyroidism    Labile hypertension    Multilevel degenerative disc disease    Sleep apnea    does not wear CPAP    Current Outpatient Medications  Medication Sig Dispense Refill   allopurinol (ZYLOPRIM) 100 MG tablet Take 200 mg by mouth daily.      amLODipine (NORVASC) 5 MG tablet Take 5 mg by mouth daily.     aspirin  81 MG EC tablet Take 81 mg by mouth daily.      colchicine 0.6 MG tablet Take 0.6 mg by mouth daily as needed (gout).     levothyroxine (SYNTHROID, LEVOTHROID) 100 MCG tablet  Take 100 mcg by mouth daily before breakfast.     lisinopril (PRINIVIL,ZESTRIL) 20 MG tablet Take 20 mg by mouth daily.  1   MISC NATURAL PRODUCTS PO Take by mouth. Clove and Olive Oil     Morphine  Sulfate ER 15 MG T12A Take 15 mg by mouth in the morning and at bedtime.     oxyCODONE  ER (XTAMPZA  ER) 13.5 MG C12A Take 13.5 mg by mouth in the morning and at bedtime.     pantoprazole (PROTONIX) 40 MG tablet Take 40 mg by mouth daily before breakfast.      tiZANidine (ZANAFLEX) 2 MG tablet Take 2 mg by mouth every 6 (six) hours as needed for muscle spasms.     Vitamin D, Ergocalciferol, (DRISDOL) 1.25 MG (50000 UNIT) CAPS capsule Take 50,000 Units by mouth once a week.     atorvastatin (LIPITOR) 40 MG tablet SMARTSIG:1 Tablet(s) By Mouth Every Evening (Patient not taking: Reported on 10/19/2023)     No current facility-administered medications for this visit.    Physical Exam BP (!) 156/100   Pulse 85   Resp 18   Ht 6\' 2"  (  1.88 m)   Wt 292 lb (132.5 kg)   SpO2 96%   BMI 37.49 kg/m  Obese 58 year old man in no acute distress Walks with cane Alert and oriented x 3 Cardiac regular rate and rhythm, no murmur Lungs clear bilaterally  Diagnostic Tests: CT ANGIOGRAPHY CHEST WITH CONTRAST   TECHNIQUE: Multidetector CT imaging of the chest was performed using the standard protocol during bolus administration of intravenous contrast. Multiplanar CT image reconstructions and MIPs were obtained to evaluate the vascular anatomy.   RADIATION DOSE REDUCTION: This exam was performed according to the departmental dose-optimization program which includes automated exposure control, adjustment of the mA and/or kV according to patient size and/or use of iterative reconstruction technique.   CONTRAST:  75mL ISOVUE -370 IOPAMIDOL  (ISOVUE -370) INJECTION 76%   COMPARISON:  November 22, 2022, Oct 30, 2019, reports only available for review.   FINDINGS: Pulmonary Embolism: No pulmonary embolism.    Cardiovascular: No cardiomegaly or pericardial effusion. Fusiform ectasia of the ascending aorta, measuring 3.7 cm. No aneurysm or aortic dissection. Normal variant 4 vessel aortic arch with a separate ostium for the left vertebral artery.   Mediastinum/Nodes: No mediastinal mass. No mediastinal, hilar, or axillary lymphadenopathy.   Lungs/Pleura: The midline trachea and bronchi are patent. No focal airspace consolidation, pleural effusion, or pneumothorax. Posterior bibasilar dependent atelectasis. Anterior right upper lobe nodule measuring 4 x 6 mm (axial 44).   Musculoskeletal: No acute fracture or destructive bone lesion. Small volume symmetric bilateral gynecomastia. Multilevel thoracic osteophytosis.   Upper Abdomen: No acute abnormality in the partially visualized upper abdomen.   Review of the MIP images confirms the above findings.   IMPRESSION: 1. Fusiform ectasia of the ascending aorta, measuring 3.7 cm. No aortic aneurysm or aortic dissection. 2.  No pulmonary embolism, pneumonia, or pleural effusion. 3. Anterior right upper lobe nodule measuring 4 x 6 mm (axial 44). While multiple prior outside CT reports document the presence of an unchanged right upper lobe nodule, I could not review these images to visually confirm that this nodule is unchanged. I have requested these images to be uploaded to PACS for review and once available, an addendum will be provided at that time.     Electronically Signed   By: Rance Burrows M.D.   On: 10/14/2023 18:42 I personally reviewed the CT images.  Also reviewed multiple radiology reports from [facility] but am unable to access the images.  There is ectasia of the ascending aorta measuring about 3.7 on coronal and sagittal views and 4 cm on axial views.  5 x 6 mm nodule in the anterior right upper lobe.  Impression: Craig Drake is a 58 year old man with a past history of hypertension, hyperlipidemia, obesity, reflux,  chronic pain due to lumbar radiculopathy, ascending aortic ectasia, and a right upper lobe lung nodule.  Ascending aorta ectasia/aneurysm-borderline for aneurysm.  He is a large gentleman so they just be top normal size.  I do think it is reasonable to continue to follow this although we may be able to increase the time interval.  Right upper lobe lung nodule-stable size compared to previous report.  Will repeat a CT in a year.  He is a non-smoker so low risk.  Hypertension-blood pressure elevated.  Follow-up with primary.  Plan: Return in 1 year with CT angio chest  I spent over 20 minutes in review of records, images, and consultation with Craig Drake today. Zelphia Higashi, MD [facility] [phone]

## 2023-12-03 NOTE — Telephone Encounter (Signed)
 Patient returned call to office, result message read to patient verbatim. Patient voiced understanding, confirmed they have already picked up new medication.  Patient would like return call from Delevan, LPN for further explanation of results.

## 2023-12-07 NOTE — Telephone Encounter (Signed)
 Left message for pt to call.

## 2023-12-07 NOTE — Telephone Encounter (Signed)
-----   Message from Prentice Medley, FNP sent at 11/28/2023  9:29 PM EDT ----- Please call the patient and let him know the monitor showed he had a 15% burden of atrial fibrillation. He will need to continue his current medications to control the rate . Also will need to start  Eliquis 5 mg po bid   I will place the order to be sent to his Pharmacy.   I will place an order to refer to be seen by EP Dr Leni. Thanks    - Ambulatory ECG monitoring was performed from 11/05/23 to 11/13/23. - The predominant rhythm was sinus rhythm, with the rate ranging from 59 to 133 and averaging 83 bpm when in sinus rhythm - 15% burden of atrial fibrillation observed, longest episode 23 hours 1 minute.  Heart rate range 65 to 205 bpm with average heart rate 120 bpm while in the arrhythmia - Rare supraventricular ectopics (PACs) were recorded with no recorded episodes of SVT - Rare ventricular ectopics (PVCs) were recorded with no recorded episodes of wide-complex tachycardia - Patient-initiated recordings/events revealed sinus rhythm - No pauses >3 seconds or high-grade AV block detected     Signed electronically by: Marinell JINNY How, MD  November 26, 2023 1:42 PM ----- Message ----- From: How Marinell Pac, MD Sent: 11/26/2023   1:43 PM EDT To: Prentice Jama Medley, FNP

## 2023-12-31 ENCOUNTER — Encounter (HOSPITAL_COMMUNITY): Payer: Self-pay

## 2023-12-31 NOTE — ED Triage Notes (Signed)
 Pt to ED c/o 10/10 left back pain with inability to urinate and n/v x3 days. Pt has h/o kidney stones.

## 2024-01-06 NOTE — ED Provider Notes (Signed)
 Emergency Department Provider Note    ED Clinical Impression   Final diagnoses:  Atypical chest pain (Primary)  Abdominal pain, unspecified abdominal location  Acute gastritis without hemorrhage, unspecified gastritis type  Back pain, unspecified back location, unspecified back pain laterality, unspecified chronicity    ED Assessment/Plan    Condition: Stable Disposition: Discharge  This chart has been completed using Dragon Medical Dictation software, and while attempts have been made to ensure accuracy, certain words and phrases may not be transcribed as intended.   History   Chief Complaint  Patient presents with  . Back Pain  . Chest Pain   HPI  Craig Drake is a 58 y.o. male  who presents today to the  emergency department complaining of midsternal chest pain since yesterday which is Is a burning sensation.  He describes it as heartburn.  Pain also is nonradiating.  He also complains of some lower back pain and spasms.  He also has some neck spasms.  Patient has a history of back pain.  He denies any bowel or bladder incontinence.  He denies any trauma.  Denies any cough or congestion.  He describes his symptoms as moderate.    Allergies: is allergic to pregabalin, ibuprofen, prednisone, tylenol  [acetaminophen ], and gabapentin . Medications: has a current medication list which includes the following long-term medication(s): aspirin , famotidine, lisinopril, and metoprolol tartrate. 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 PSHx:  has a past surgical history that includes Knee arthroscopy (Left, 1988); Cervical fusion (2001); Band hemorrhoidectomy (2017); CT Neck w/o CM [facility] Historical Result) (03/02/2018); Hernia repair; and Fracture surgery. SocHx:  reports that he has never smoked. He has never been exposed to tobacco smoke. He has never used smokeless tobacco. He reports that he does not drink alcohol and does not use drugs. Allergies, Medications, Medical, Surgical, and Social History were reviewed as documented above.   Social Drivers of Health with Concerns   Physical Activity: Inactive (01/06/2024)   Exercise Vital Sign   . Days of Exercise per Week: 0 days   . Minutes of Exercise per Session: 0 min  Social Connections: Moderately Isolated (01/06/2024)   Social Connection and Isolation Panel   . Frequency of Communication with Friends and Family: More than three times a week   . Frequency of Social Gatherings with Friends and Family: Three times a week   . Attends Religious Services: 1 to 4 times per year   . Active Member of Clubs or Organizations: No   . Attends Banker Meetings: Never   . Marital Status: Divorced  Insurance account manager: Internet connectivity concern identified (01/06/2024)   Insurance account manager   . Do you have access to internet services: No   . How do you connect to the internet: Not on file   . Is your internet connection strong enough  for you to watch video on your device without major problems?: Not on file   . Do you have enough data to get through the month?: Not on file   . Does at least one of the devices have a camera that you can use for video chat?: Not on file     Review Of  Systems  Review of Systems  Constitutional:  Negative for fever.  HENT:  Negative for congestion.   Respiratory:  Negative for chest tightness and shortness of breath.   Cardiovascular:  Positive for chest pain.  Gastrointestinal:  Negative for abdominal pain.  Musculoskeletal:  Positive for back pain.  Skin:  Negative for color change.  Psychiatric/Behavioral:  Negative for behavioral problems.   All other systems reviewed and are negative.   Physical Exam   BP 144/97   Pulse 112   Temp 36.3 C (97.4 F) (Temporal)   Resp 17   Ht 188 cm (6' 2)   Wt 80.7 kg (178 lb)   SpO2 97%   BMI 22.85 kg/m   Physical Exam Vitals and nursing note reviewed.  Constitutional:      General: He is not in acute distress. HENT:     Head: Normocephalic.  Eyes:     Conjunctiva/sclera: Conjunctivae normal.  Cardiovascular:     Rate and Rhythm: Regular rhythm.     Pulses: Normal pulses.     Heart sounds: Normal heart sounds.  Pulmonary:     Effort: No respiratory distress.     Breath sounds: Normal breath sounds.  Abdominal:     General: There is no distension.     Tenderness: There is no abdominal tenderness. There is no guarding or rebound.     Comments: Abdomen is soft, nontender, normoactive bowel sounds.  Musculoskeletal:        General: Tenderness present. No deformity.     Comments: There is slight lumbar paravertebral tenderness and spasms.  No step-offs.  Negative straight leg raise test.  Normal exam of all extremities.  Skin:    General: Skin is warm.     Capillary Refill: Capillary refill takes 2 to 3 seconds.     Comments: Normal cap refill.  Neurological:     General: No focal deficit present.     Cranial Nerves: No cranial nerve deficit.     Sensory: No sensory deficit.     Coordination: Coordination normal.     Comments: There is no motor, sensory or cerebellar deficits.  Nonfocal neurologic exam.  GCS is 15.  Cranial nerves grossly intact.  Psychiatric:         Mood and Affect: Mood normal.     ED Course  Medical Decision Making Differential-includes GERD versus typical presentation for ACS versus gastritis versus pancreatitis. 2.  Lumbar strain.  2:31 AM Patient admits of burning epigastric pain.  Also states he has some back pain and spasms.  High-sensitivity troponin negative x 2.  Labs unremarkable.  Will treat him symptomatically.  Patient does not have any flank pain.  I do not think his pain is from renal colic.  He does have known history of kidney stones as per his last ED visit.  However, with pain epigastric pain and mid back pain, I do not think this is renal colic.  He does have leukocytosis today but he is with a leukocytosis in his previous visits.  Also has polycythemia.  3:50 AM Patient is doing well.  Given his persistent complaints of pain going to  his back, will get a CTA to rule out dissection or aneurysm.  CT is negative.  I discussed findings with patient.  Patient is stable for discharge.  I have reviewed my clinical findings and studies and my clinical impression with the patient. The patient has expressed understanding that at this time there is no evidence for a more malignant underlying process, but the patient also understands that early in the process of a condition such as this, an initial workup can be falsely reassuring. I have counseled the patient and discussed follow-up with the patient, stressing the importance of appropriate follow-up. I have also counseled the patient to return if worse or any concerns. Routine discharge counseling was given to the patient and the patient understands that worsening, changing or persistent symptoms should prompt an immediate call or follow up with their primary physician or return to the emergency department for reevaluation. Patient has expressed understanding.      Problems Addressed: Abdominal pain, unspecified abdominal location: acute illness or injury Acute gastritis  without hemorrhage, unspecified gastritis type: acute illness or injury that poses a threat to life or bodily functions Atypical chest pain: acute illness or injury that poses a threat to life or bodily functions  Amount and/or Complexity of Data Reviewed Labs: ordered. Decision-making details documented in ED Course. Radiology: ordered. Decision-making details documented in ED Course. ECG/medicine tests: ordered and independent interpretation performed. Decision-making details documented in ED Course.  Risk Prescription drug management. Decision regarding hospitalization.     Procedures   Encounter Date: 01/07/24  ECG 12 Lead  Result Value   EKG Systolic BP    EKG Diastolic BP    EKG Ventricular Rate 126   EKG Atrial Rate 126   EKG P-R Interval 180   EKG QRS Duration 72   EKG Q-T Interval 280   EKG QTC Calculation 405   EKG Calculated P Axis 47   EKG Calculated R Axis 46   EKG Calculated T Axis 41   QTC Fredericia 358   Narrative   Sinus tachycardia Low voltage QRS Borderline ECG When compared with ECG of 31-Dec-2023 14:43, Vent. rate has increased by  50 bpm Minimal criteria for Anterior infarct are no longer present Confirmed by Cherie Searle ([id number]) on 01/07/2024 12:56:15 AM     ED Results Results for orders placed or performed during the hospital encounter of 01/07/24  Comprehensive Metabolic Panel  Result Value Ref Range   Sodium 140 135 - 145 mmol/L   Potassium 4.0 3.5 - 5.0 mmol/L   Chloride 103 98 - 107 mmol/L   CO2 25.1 21.0 - 32.0 mmol/L   Anion Gap 12 (H) 3 - 11 mmol/L   BUN 6 (L) 8 - 20 mg/dL   Creatinine 8.81 9.19 - 1.30 mg/dL   BUN/Creatinine Ratio 5    eGFR CKD-EPI (2021) Male 70 >=60 mL/min/1.1m2   Glucose 116 70 - 179 mg/dL   Calcium 9.5 8.5 - 89.8 mg/dL   Albumin 3.8 3.5 - 5.0 g/dL   Total Protein 8.0 6.0 - 8.0 g/dL   Total Bilirubin 0.4 0.3 - 1.2 mg/dL   AST 23 15 - 40 U/L   ALT 30 12 - 78 U/L   Alkaline Phosphatase 88 46 - 116 U/L   Magnesium Level  Result Value Ref Range   Magnesium 1.6 1.6 - 2.6 mg/dL  Lipase  Result Value Ref Range   Lipase 40 16 - 77 U/L  hsTroponin I (serial 0-2-6H w/ delta)  Result  Value Ref Range   hsTroponin I 29 <=53 ng/L  hsTroponin I - 2 Hour  Result Value Ref Range   hsTroponin I 32 <=53 ng/L   delta hsTroponin I 3 <=7 ng/L  ECG 12 Lead  Result Value Ref Range   EKG Systolic BP  mmHg   EKG Diastolic BP  mmHg   EKG Ventricular Rate 126 BPM   EKG Atrial Rate 126 BPM   EKG P-R Interval 180 ms   EKG QRS Duration 72 ms   EKG Q-T Interval 280 ms   EKG QTC Calculation 405 ms   EKG Calculated P Axis 47 degrees   EKG Calculated R Axis 46 degrees   EKG Calculated T Axis 41 degrees   QTC Fredericia 358 ms  CBC w/ Differential  Result Value Ref Range   WBC 16.2 (H) 4.0 - 10.5 10*9/L   RBC 7.30 (H) 4.10 - 5.60 10*12/L   HGB 19.8 (HH) 12.5 - 17.0 g/dL   HCT 40.4 (H) 63.9 - 49.9 %   MCV 81.5 80.0 - 98.0 fL   MCH 27.1 27.0 - 34.0 pg   MCHC 33.3 32.0 - 36.0 g/dL   RDW 85.2 (H) 88.4 - 85.4 %   MPV 10.3 7.4 - 10.4 fL   Platelet 242 140 - 415 10*9/L   Neutrophils % 79.1 %   Lymphocytes % 11.8 %   Monocytes % 6.9 %   Eosinophils % 1.0 %   Basophils % 0.4 %   Absolute Neutrophils 12.8 (H) 1.8 - 7.8 10*9/L   Absolute Lymphocytes 1.9 0.7 - 4.5 10*9/L   Absolute Monocytes 1.1 (H) 0.1 - 1.0 10*9/L   Absolute Eosinophils 0.2 0.0 - 0.4 10*9/L   Absolute Basophils 0.1 0.0 - 0.2 10*9/L  Urinalysis with Microscopy with Culture Reflex  Result Value Ref Range   Color, UA Yellow    Clarity, UA Clear Clear   Specific Gravity, UA 1.026 (H) 1.010 - 1.025   pH, UA 5.5 5.0 - 8.0   Leukocyte Esterase, UA Negative Negative   Nitrite, UA Negative Negative   Protein, UA 30 mg/dL (A) Negative   Glucose, UA Negative Negative, Trace   Ketones, UA Negative Negative   Urobilinogen, UA <2.0 mg/dL <7.9 mg/dL   Bilirubin, UA Negative Negative   Blood, UA Negative Negative   CTA Aortic  Dissection Result Date: 01/07/2024 Exam: CT Angiogram of the Chest, Abdomen, and Pelvis (Dissection Protocol)  History: Chest and back pain  Technique: Initial unenhanced CT of the chest. This was followed by a spiral CT of the chest and abdomen through the aortic bifurcation with IV contrast. Sagittal and coronal reconstructions performed. Image postprocessing and 3-D reconstructions at an independent workstation is performed and the rendered images that are generated are sent to the patient's image file in PACS. AEC (automated exposure control) and/or manual techniques such as size-specific kV and mAs are employed where appropriate to reduce radiation exposure for all CT exams.  Comparison: None  Vascular Findings:  Thoracic aorta: No thoracic aortic aneurysm or dissection. Branch vessels of the arch enhance appropriately.  Abdominal aorta: No aneurysm or evidence for dissection. Branch vessels enhance appropriately, no significant acquired stenosis.  Pulmonary arteries: Normal in caliber. No evidence of a pulmonary embolism.   Non-vascular findings:  LUNGS: Background parenchyma normal. No groundglass or consolidation. No suspicious pulmonary nodules. No pleural effusion or pneumothorax.  MEDIASTINUM: Normal heart size. No pericardial effusion. Esophagus is unremarkable. No mediastinal lymphadenopathy.  HEPATOBILIARY:  Negative.  PANCREAS:  Negative.  SPLEEN:  Negative.  ADRENALS:  Negative.  KIDNEYS/URETERS:  Multiple small calcifications in both kidneys. Symmetric nephrograms. No hydronephrosis, no abnormal perinephric fluid. There is no visible obstructing stone in either ureter.  BOWEL/MESENTERY:  Pancolonic diverticulosis. No acute inflammatory changes. The stomach and small bowel are unremarkable.  PELVIS:  No pelvic free fluid. The bladder is unremarkable.  BONES/SOFT TISSUES:  Evidence of previous left inguinal hernia repair. Regional bones are unremarkable for age.     1.    No aortic aneurysm or  dissection. No evidence for pulmonary embolism.  2.    No acute process in the chest, abdomen or pelvis.  3.    Bilateral nephrolithiasis. No evidence for acute obstructive uropathy.  4.    Pancolonic diverticulosis.  5.    Additional chronic changes as detailed above.  Signed (Electronic Signature): 01/07/2024 3:35 AM Signed By: Emeline KATHEE Milroy, MD  ECG 12 Lead Result Date: 01/07/2024 Sinus tachycardia Low voltage QRS Borderline ECG When compared with ECG of 31-Dec-2023 14:43, Vent. rate has increased by  50 bpm Minimal criteria for Anterior infarct are no longer present Confirmed by Cherie Searle ([id number]) on 01/07/2024 12:56:15 AM  XR Chest Portable Result Date: 01/06/2024 Exam:  Chest Single Frontal View  History:  Chest pain  Technique: Single view chest  Comparison:  07/26/2023  Findings:   No airspace consolidation, pleural effusion or pneumothorax. Mediastinal contours are unremarkable. Pulmonary vasculature appears normal.   Partially imaged instrumented fusion of the lower cervical spine.     No acute cardiopulmonary abnormality.    Signed (Electronic Signature): 01/06/2024 11:18 PM Signed By: Emeline KATHEE Milroy, MD   Medications Administered:  Medications  aluminum-magnesium hydroxide-simethicone (MAALOX MAX) 80-80-8 mg/mL oral suspension (30 mL Oral Given 01/06/24 2240)    Followed by  lidocaine  (XYLOCAINE ) 2% viscous mucosal solution (5 mL Oral Given 01/06/24 2240)  traMADol (ULTRAM) tablet 50 mg (50 mg Oral Given 01/06/24 2240)  methocarbamol (ROBAXIN) tablet 500 mg (500 mg Oral Given 01/06/24 2240)  morphine  4 mg/mL injection 4 mg (4 mg Intravenous Given 01/07/24 0243)  iohexol (OMNIPAQUE) 350 mg iodine/mL solution 100 mL (100 mL Intravenous Given 01/07/24 0325)  ondansetron  (ZOFRAN ) injection 4 mg (4 mg Intravenous Given 01/07/24 0349)    Discharge Medications (Medications Prescribed during this  ED visit and Patient's Home Medications) :    Your Medication List     START taking these  medications    dicyclomine 20 mg tablet Commonly known as: BENTYL Take 1 tablet (20 mg total) by mouth four (4) times a day as needed (Abdominal cramping/pain).   esomeprazole 40 MG capsule Commonly known as: NEXIUM Take 1 capsule (40 mg total) by mouth daily before breakfast.       CHANGE how you take these medications    ondansetron  8 MG disintegrating tablet Commonly known as: ZOFRAN -ODT Take 1 tablet (8 mg total) by mouth every eight (8) hours as needed for nausea. What changed: Another medication with the same name was added. Make sure you understand how and when to take each.   ondansetron  4 MG disintegrating tablet Commonly known as: ZOFRAN -ODT Take 1 tablet (4 mg total) by mouth every eight (8) hours as needed for nausea for up to 7 days. What changed: Another medication with the same name was added. Make sure you understand how and when to take each.   ondansetron  4 MG disintegrating tablet Commonly known as: ZOFRAN -ODT Take 1 tablet (4 mg total) by mouth  every eight (8) hours as needed for nausea for up to 7 days. What changed: You were already taking a medication with the same name, and this prescription was added. Make sure you understand how and when to take each.       ASK your doctor about these medications    apixaban 5 mg Tab Commonly known as: ELIQUIS Take 1 tablet (5 mg total) by mouth two (2) times a day.   aspirin  81 MG tablet Commonly known as: ECOTRIN Take 1 tablet (81 mg total) by mouth daily.   cyclobenzaprine  10 MG tablet Commonly known as: FLEXERIL  TAKE 1 TABLET BY MOUTH TWICE DAILY AS NEEDED FOR muscle SPASMS.   famotidine 40 MG tablet Commonly known as: PEPCID Take 1 tablet (40 mg total) by mouth every evening.   HYDROmorphone  4 MG tablet Commonly known as: DILAUDID  Take 1 tablet (4 mg total) by mouth every four (4) hours as needed for moderate pain for up to 5 days. Ask about: Should I take this medication?   hyoscyamine 0.125 mg  SL tablet Commonly known as: LEVSIN/SL Place 1 tablet (0.125 mg total) under the tongue every four (4) hours as needed for cramping or diarrhea.   lidocaine  5 % cream Commonly known as: ELA-MAX Apply topically Three (3) times a day as needed. Ask about: Should I take this medication?   lisinopril 20 MG tablet Commonly known as: PRINIVIL,ZESTRIL TAKE 1 TABLET BY MOUTH TWICE DAILY   metoPROLOL tartrate 50 MG tablet Commonly known as: Lopressor Take 1 tablet (50 mg total) by mouth two (2) times a day.   morphine  15 MG 12 hr tablet Commonly known as: MS Contin  Take 1 tablet (15 mg total) by mouth two (2) times a day.   oxyCODONE  10 mg immediate release tablet Commonly known as: ROXICODONE  TAKE 1 TABLET BY MOUTH EVERY 6 HOURS AS NEEDED FOR pain.   tamsulosin 0.4 mg capsule Commonly known as: FLOMAX Take 1 capsule (0.4 mg total) by mouth daily for 10 days.   WEGOVY 1 mg/0.5 mL injection pen Generic drug: semaglutide (weight loss) inject 0.5ml under the skin once a week.   XYOSTED 100 mg/0.5 mL Atin Generic drug: testosterone enanthate INJECT 0.5 ML UNDER THE SKIN ONCE A WEEK          Fote, Ardeen Hanger, MD 01/07/24 [phone]

## 2024-01-12 NOTE — Telephone Encounter (Signed)
 Fax request sent to [facility] requesting most recent OV notes for provider to review

## 2024-01-12 NOTE — Progress Notes (Signed)
 Chief concern/reason for visit:  Pain (Patient reports he is having a lot of pain in his neck arms , legs and feet, reports he was discharged from the pain management facility he was going to recently  and has not  been able to sleep  due to pain /Has an appt with urology on 01/14/24 for kidney stone also /Has taken BP medication today approx  0700 )    Assessment & Plan Chronic pain syndrome Unable to verify that the patient has been discharged from pain management. Orders: .  carisoprodol (SOMA) 350 MG tablet; Take 1 tablet (350 mg total) by mouth four (4) times a day as needed for muscle spasms for up to 5 days. .  Pain Clinic; Future .  oxyCODONE  (ROXICODONE ) 10 mg immediate release tablet; Take 1 tablet (10 mg total) by mouth every four (4) hours as needed for pain.  Primary hypertension Patient reports that if he can get his pain under control his blood pressure usually goes back within normal limits.  Discussed doing a follow-up with cardiologist.    Degeneration of intervertebral disc of lumbar region, unspecified whether pain present Discussed stopping cyclobenzaprine  and switching to Soma to see if there is more relief for the muscle spasms. Orders: .  carisoprodol (SOMA) 350 MG tablet; Take 1 tablet (350 mg total) by mouth four (4) times a day as needed for muscle spasms for up to 5 days.  History of fusion of cervical spine Plan is to establish with a new pain clinic ASAP, referral placed.  Discussed that I will not be able to provide further refills of the medications and the patient needs to contact the referral center to get the pain clinic appointment made. Orders: .  Pain Clinic; Future .  oxyCODONE  (ROXICODONE ) 10 mg immediate release tablet; Take 1 tablet (10 mg total) by mouth every four (4) hours as needed for pain.     No follow-ups on file.  Subjective HPI Pain (Patient reports he is having a lot of pain in his neck arms , legs and feet, reports he was  discharged from the pain management facility he was going to and has not  been abl to sleep  due to pain /Has an appt with urology on 01/14/24 for kidney stone/Has taken BP medication today approx  0700 )  On 6/20 patient was given 30 morphine  tablets 6/19 the patient was given 120 oxycodone  7/11 the patient was given 10 hydromorphone  tablets  Patient reports that he has not slept in 3 days due to pain.  Patient reports a history of brain lesions, spinal fusion, back pain, chronic pain syndrome chronic liver and kidney disease cannot take Tylenol  or ibuprofen, patient reports coronary artery disease, A-fib and hypertension and cannot take any type of NSAIDs or prednisone.   Objective The following portions of the patient's chart were reviewed in this encounter and updated as appropriate: allergies, medications, problems, medical history, surgical history, and family history.  BP 142/100 (BP Site: R Arm, BP Position: Sitting, BP Cuff Size: Medium)   Pulse 122   Temp 36.5 C (97.7 F) (Temporal)   Resp 22   Ht 188 cm (6' 2.02)   Wt (!) 126.4 kg (278 lb 11.2 oz)   SpO2 94%   BMI 35.77 kg/m   Physical Exam: Physical Exam Vitals and nursing note reviewed.  Constitutional:      General: He is in acute distress.     Appearance: Normal appearance. He is obese. He is  toxic-appearing and diaphoretic. He is not ill-appearing.  HENT:     Head: Normocephalic.     Right Ear: External ear normal.     Left Ear: External ear normal.     Nose: Nose normal.  Eyes:     Extraocular Movements: Extraocular movements intact.  Cardiovascular:     Rate and Rhythm: Normal rate and regular rhythm.  Pulmonary:     Effort: Pulmonary effort is normal. No respiratory distress.     Breath sounds: Normal breath sounds. No stridor. No wheezing, rhonchi or rales.  Chest:     Chest wall: No tenderness.  Musculoskeletal:     Cervical back: Normal range of motion and neck supple.     Comments: Ambulating with a  cane  Skin:    General: Skin is warm.     Findings: No rash.  Neurological:     General: No focal deficit present.     Mental Status: He is alert. Mental status is at baseline.          I personally spent 20 minutes face-to-face and non-face-to-face in the care of this patient, which includes all pre, intra, and post visit time on the date of service.      Lauraine Fus, NP

## 2024-01-12 NOTE — Telephone Encounter (Addendum)
 Patient presented to office at 1407 for schedule 1440 appt discussed providers recommendations and verbalizes understanding reports he will discuss further his concerns with provider at this  F2F visit

## 2024-01-13 DIAGNOSIS — E7800 Pure hypercholesterolemia, unspecified: Secondary | ICD-10-CM | POA: Insufficient documentation

## 2024-01-13 DIAGNOSIS — R6882 Decreased libido: Secondary | ICD-10-CM | POA: Insufficient documentation

## 2024-01-13 DIAGNOSIS — D751 Secondary polycythemia: Secondary | ICD-10-CM | POA: Insufficient documentation

## 2024-01-13 DIAGNOSIS — N400 Enlarged prostate without lower urinary tract symptoms: Secondary | ICD-10-CM | POA: Insufficient documentation

## 2024-01-13 DIAGNOSIS — N200 Calculus of kidney: Secondary | ICD-10-CM | POA: Insufficient documentation

## 2024-01-13 DIAGNOSIS — R7303 Prediabetes: Secondary | ICD-10-CM | POA: Insufficient documentation

## 2024-01-13 DIAGNOSIS — R7989 Other specified abnormal findings of blood chemistry: Secondary | ICD-10-CM | POA: Insufficient documentation

## 2024-01-13 DIAGNOSIS — G479 Sleep disorder, unspecified: Secondary | ICD-10-CM | POA: Insufficient documentation

## 2024-01-13 DIAGNOSIS — E291 Testicular hypofunction: Secondary | ICD-10-CM | POA: Insufficient documentation

## 2024-01-13 NOTE — Progress Notes (Unsigned)
 Name: Craig Drake DOB: 1965-11-18 MRN: [id number]  History of Present Illness: Craig Drake is a 58 y.o. male who presents today as a new patient at [facility] [place]. All available relevant medical records have been reviewed.  Relevant History includes: 1. BPH. - 08/24/2023: Normal PSA (1.52). 2. Hypogonadism.  - Symptoms have included low libido. - He is on testosterone replacement therapy (Xyosted injections) as prescribed elsewhere. 3. Kidney stone(s). - He {Actions; denies-reports:120008} prior history of kidney stone procedure(s) ***including ***ESWL ***ureteroscopic stone manipulation ***PCNL.  He reports concern of kidney stone(s).   Recent history: > 12/31/2023:  - Seen in ER for left flank pain x3 days. CMP with normal renal function (GFR >60; creatinine 1.22). CBC with leukocytosis (WBC 13.8). UA microscopy showed 0 WBC/hpf, 20 RBC/hpf, few bacteria. CT showed bilateral kidney stones and a 6 mm distal left ureteral stone just proximal to the UVJ with mild to moderate left hydroureteronephrosis.  Per ER note he had no relief at home from his Roxicodone  and MS Contin , which he was receiving from pain management clinic for multilevel degenerative disc disease and chronic pain. Required Dilaudid  and ketamine in the ER for pain management. He was discharged with prescriptions for Flomax, Zofran , Levsin, and Dilaudid  4 mg (#10 dispensed 12/31/2023 per PMP aware controlled substance registry).  > 01/06/2024:  - Seen in ER for burning epigastric pain. Also states he has some back pain and spasms. High-sensitivity troponin negative x 2. Labs unremarkable. Will treat him symptomatically. Patient does not have any flank pain. I do not think his pain is from renal colic. He does have known history of kidney stones as per his last ED visit. However, with pain epigastric pain and mid back pain, I do not think this is renal colic. He does have leukocytosis today but he is with a  leukocytosis in his previous visits. Also has polycythemia. - CT showed Multiple small calcifications in both kidneys. Symmetric nephrograms. No hydronephrosis, no abnormal perinephric fluid. There is no visible obstructing stone in either ureter.  > 01/12/2024: - Seen by PCP. Per note: Pain (Patient reports he is having a lot of pain in his neck arms , legs and feet, reports he was discharged from the pain management facility he was going to recently and has not been able to sleep due to pain. - Plan is to establish with a new pain clinic ASAP, referral placed. Discussed that I will not be able to provide further refills of the medications and the patient needs to contact the referral center to get the pain clinic appointment made.  Today: He {Actions; denies-reports:120008} suspected stone migration / passage. He {Actions; denies-reports:120008} flank pain or abdominal pain. He {Actions; denies-reports:120008} fevers, nausea, or vomiting.  He {Actions; denies-reports:120008} increased urinary urgency, frequency, nocturia, dysuria, gross hematuria, hesitancy, straining to void, or sensations of incomplete emptying.  He has a history of polycythemia and is on testosterone replacement therapy. On 01/06/2024 his hemoglobin was elevated (19.8). He {Actions; denies-reports:120008} being established with Hematology.   ***Hematology referral ***Advised blood donation ***Discussed how TRT can contribute to erythrocytosis, which can increase risk for serious health complications due to blood clots including DVT, pulmonary embolism, stroke, heart attack, etc.  Medications: Current Outpatient Medications  Medication Sig Dispense Refill   allopurinol (ZYLOPRIM) 100 MG tablet Take 200 mg by mouth daily.      amLODipine (NORVASC) 5 MG tablet Take 5 mg by mouth daily.     aspirin  81 MG EC  tablet Take 81 mg by mouth daily.      atorvastatin (LIPITOR) 40 MG tablet SMARTSIG:1 Tablet(s) By Mouth Every  Evening (Patient not taking: Reported on 10/19/2023)     colchicine 0.6 MG tablet Take 0.6 mg by mouth daily as needed (gout).     levothyroxine (SYNTHROID, LEVOTHROID) 100 MCG tablet Take 100 mcg by mouth daily before breakfast.     lisinopril (PRINIVIL,ZESTRIL) 20 MG tablet Take 20 mg by mouth daily.  1   MISC NATURAL PRODUCTS PO Take by mouth. Clove and Olive Oil     Morphine  Sulfate ER 15 MG T12A Take 15 mg by mouth in the morning and at bedtime.     oxyCODONE  ER (XTAMPZA  ER) 13.5 MG C12A Take 13.5 mg by mouth in the morning and at bedtime.     pantoprazole (PROTONIX) 40 MG tablet Take 40 mg by mouth daily before breakfast.      tiZANidine (ZANAFLEX) 2 MG tablet Take 2 mg by mouth every 6 (six) hours as needed for muscle spasms.     Vitamin D, Ergocalciferol, (DRISDOL) 1.25 MG (50000 UNIT) CAPS capsule Take 50,000 Units by mouth once a week.     No current facility-administered medications for this visit.    Allergies: Allergies  Allergen Reactions   Ibuprofen    Lyrica [Pregabalin] Swelling    Past Medical History:  Diagnosis Date   Anxiety    and atypical chest pain   Chronic back pain    DDD (degenerative disc disease)    Depression    GERD (gastroesophageal reflux disease)    Gout    Hemorrhoids    History of kidney stones    Hypercholesteremia    Hypothyroidism    Labile hypertension    Multilevel degenerative disc disease    Sleep apnea    does not wear CPAP   Past Surgical History:  Procedure Laterality Date   BIOPSY  10/07/2015   Procedure: BIOPSY;  Surgeon: Craig CHRISTELLA Hollingshead, MD;  [facility];  Service: Endoscopy;;  esophageal bx;    COLONOSCOPY WITH PROPOFOL  N/A 10/07/2015   Craig Drake- diverticulosis in the entire examined colon, internal hemorrhoids, the examination was o/w normal   COLONOSCOPY WITH PROPOFOL  N/A 09/18/2019    pancolonic diverticulosis, two 3-8 mm polyps at IC valve s/p removal. Tubular adenomas. Surveillance 5 years.     ESOPHAGOGASTRODUODENOSCOPY (EGD) WITH PROPOFOL  N/A 10/07/2015   Craig Drake- esophageal mucosa changes, dilated, small hiatal hernia, the examination was o/w normal bx= benign squamous mucosa with minimal reactive changes   HEMORRHOID BANDING  01/20/16   Craig Drake   INGUINAL HERNIA REPAIR Left 04/21/2017   Procedure: LEFT INGUINAL HERNIORRHAPHY WITH MESH;  Surgeon: Mavis Anes, MD;  Location: [facility];  Service: General;  Laterality: Left;   KNEE ARTHROSCOPY Left    MALONEY DILATION N/A 10/07/2015   Procedure: AGAPITO DILATION;  Surgeon: Craig CHRISTELLA Hollingshead, MD;  [facility];  Service: Endoscopy;  Laterality: N/A;   MANDIBLE FRACTURE SURGERY     wired shut after a fall   neck fusion     POLYPECTOMY  09/18/2019   Procedure: POLYPECTOMY;  Surgeon: Drake Craig CHRISTELLA, MD;  [facility]: [facility];  Service: Endoscopy;;   Family History  Problem Relation Age of Onset   [name] Mother    Hypertension Mother    Colon cancer Mother 25       Passed away 2011-01-20 from stage IV CRC   Lung cancer Father    Liver  disease Neg Hx    Celiac disease Neg Hx    Social History   Socioeconomic History   Marital status: Divorced    Spouse name: Not on file   Number of children: Not on file   Years of education: Not on file   Highest education level: Not on file  Occupational History   Not on file  Tobacco Use   Smoking status: Never   Smokeless tobacco: Never   Tobacco comments:    Never smoked  Vaping Use   Vaping status: Never Used  Substance and Sexual Activity   Alcohol use: No    Alcohol/week: 0.0 standard drinks of alcohol    Comment: no prior heavy use   Drug use: No   Sexual activity: Never    Birth control/protection: None  Other Topics Concern   Not on file  Social History Narrative   Not on file   Social Drivers of Health   Financial Resource Strain: Low Risk  (01/06/2024)   Received from [facility]   Overall Financial Resource Strain (CARDIA)    How hard is it for you to  pay for the very basics like food, housing, medical care, and heating?: Not very hard  Food Insecurity: No Food Insecurity (01/12/2024)   Received from [facility]   Hunger Vital Sign    Within the past 12 months, you worried that your food would run out before you got the money to buy more.: Never true    Within the past 12 months, the food you bought just didn't last and you didn't have money to get more.: Never true  Transportation Needs: No Transportation Needs (01/12/2024)   Received from [facility]   PRAPARE - Transportation    Lack of Transportation (Medical): No    Lack of Transportation (Non-Medical): No  Physical Activity: Inactive (01/06/2024)   Received from [facility]   Exercise Vital Sign    On average, how many days per week do you engage in moderate to strenuous exercise (like a brisk walk)?: 0 days    On average, how many minutes do you engage in exercise at this level?: 0 min  Stress: No Stress Concern Present (01/06/2024)   Received from [facility] - Occupational Stress Questionnaire    Do you feel stress - tense, restless, nervous, or anxious, or unable to sleep at night because your mind is troubled all the time - these days?: Only a little  Social Connections: Moderately Isolated (01/06/2024)   Received from [facility]   Social Connection and Isolation Panel    In a typical week, how many times do you talk on the phone with family, friends, or neighbors?: More than three times a week    How often do you get together with friends or relatives?: Three times a week    How often do you attend church or religious services?: 1 to 4 times per year    Do you belong to any clubs or organizations such as church groups, unions, fraternal or athletic groups, or school groups?: No    How often do you attend meetings of the clubs or organizations you belong to?: Never    Are you married, widowed, divorced, separated,  never married, or living with a partner?: Divorced  Intimate Partner Violence: Not At Risk (01/12/2024)   Received from [facility]   Humiliation, Afraid, Rape, and Kick questionnaire  Within the last year, have you been afraid of your partner or ex-partner?: No    Within the last year, have you been humiliated or emotionally abused in other ways by your partner or ex-partner?: No    Within the last year, have you been kicked, hit, slapped, or otherwise physically hurt by your partner or ex-partner?: No    Within the last year, have you been raped or forced to have any kind of sexual activity by your partner or ex-partner?: No    SUBJECTIVE  Review of Systems Constitutional: Patient denies any unintentional weight loss or change in strength lntegumentary: Patient denies any rashes or pruritus Cardiovascular: Patient denies chest pain or syncope Respiratory: Patient denies shortness of breath Gastrointestinal: ***Patient denies nausea, vomiting, constipation, or diarrhea ***As per HPI Musculoskeletal: Patient denies muscle cramps or weakness Neurologic: Patient denies convulsions or seizures Allergic/Immunologic: Patient denies recent allergic reaction(s) Hematologic/Lymphatic: Patient denies bleeding tendencies Endocrine: Patient denies heat/cold intolerance  GU: As per HPI.  OBJECTIVE There were no vitals filed for this visit. There is no height or weight on file to calculate BMI.  Physical Examination Constitutional: No obvious distress; patient is non-toxic appearing  Cardiovascular: No visible lower extremity edema.  Respiratory: The patient does not have audible wheezing/stridor; respirations do not appear labored  Gastrointestinal: Abdomen non-distended Musculoskeletal: Normal ROM of UEs  Skin: No obvious rashes/open sores  Neurologic: CN 2-12 grossly intact Psychiatric: Answered questions appropriately with normal affect  Hematologic/Lymphatic/Immunologic: No  obvious bruises or sites of spontaneous bleeding  UA: ***negative ***positive for *** leukocytes, *** blood, ***nitrites Urine microscopy: *** WBC/hpf, *** RBC/hpf, *** bacteria ***glucosuria (secondary to ***Jardiance ***Farxiga use) ***otherwise unremarkable  PVR: *** ml  ASSESSMENT Kidney stones  Benign prostatic hyperplasia, unspecified whether lower urinary tract symptoms present - 12/31/2023: CT showed Prostate is enlarged measuring 5.4 cm.  Hypogonadism male  Polycythemia  ***We reviewed recent imaging results; ***awaiting radiology results, appears to have ***no acute findings.  For acute GU stone symptoms we agreed to proceed with: - ***RUS *** KUB ***CT stone study - ***BMP ***CMP  - ***Flomax 0.4 mg daily for medical expulsive therapy (MET) - For pain management, we discussed the use of OTC analgesics versus opioids ***as prescribed at ER ***A 5 day prescription was sent for ***Percocet for PRN use for severe pain.  - For nausea / vomiting: ***Zofran  / ***Phenergan  PRN ***prescription sent ***as prescribed at ER   We discussed the various treatment options including  ***medical expulsive therapy (MET) ***extracorporeal shock wave lithotripsy (ESWL) ***ureteroscopic stone manipulation (URS) ***percutaneous nephrolithotomy (PCNL) We discussed possible risks and benefits of intervention including but not limited to: including pain, infection, sepsis, UTI, ureter perforation, need for stenting, post-op ureteral stricture, hematuria, possible need for follow up procedures.  He elected to proceed with ***MET ***ESWL ***URS ***PCNL ; surgery order placed.   ***Will consult with ***Dr. Sherrilee and notify patient of his recommendations ***for next steps / ***following review of imaging results.  Will plan to follow up in ***2 weeks ***6 months with ***KUB ***RUS for stone surveillance or sooner if needed.   He was advised to contact urology provider or go to the ER if He  develops fever >101F, uncontrollable pain, or other significantly concerning symptoms prior to follow up.  Patient verbalized understanding and agreement. All questions were answered.   PLAN Advised the following: ***Flomax (Tamsulosin) 0.4 mg daily. ***Analgesics PRN for pain. ***Zofran  PRN for nausea. ***No follow-ups on file.  No orders of  the defined types were placed in this encounter.   It has been explained that the patient is to follow regularly with their PCP in addition to all other providers involved in their care and to follow instructions provided by these respective offices. Patient advised to contact urology clinic if any urologic-pertaining questions, concerns, new symptoms or problems arise in the interim period.  There are no Patient Instructions on file for this visit.  Electronically signed by: Lauraine KYM Oz, MSN, FNP-C, CUNP 01/13/2024 10:10 AM

## 2024-01-14 ENCOUNTER — Encounter: Payer: Self-pay | Admitting: Urology

## 2024-01-14 ENCOUNTER — Ambulatory Visit (INDEPENDENT_AMBULATORY_CARE_PROVIDER_SITE_OTHER): Admitting: Urology

## 2024-01-14 VITALS — BP 122/80 | HR 91

## 2024-01-14 DIAGNOSIS — N200 Calculus of kidney: Secondary | ICD-10-CM

## 2024-01-14 DIAGNOSIS — R5383 Other fatigue: Secondary | ICD-10-CM

## 2024-01-14 DIAGNOSIS — D751 Secondary polycythemia: Secondary | ICD-10-CM | POA: Diagnosis not present

## 2024-01-14 DIAGNOSIS — N400 Enlarged prostate without lower urinary tract symptoms: Secondary | ICD-10-CM

## 2024-01-14 DIAGNOSIS — R6882 Decreased libido: Secondary | ICD-10-CM

## 2024-01-14 DIAGNOSIS — E291 Testicular hypofunction: Secondary | ICD-10-CM | POA: Diagnosis not present

## 2024-01-14 LAB — MICROSCOPIC EXAMINATION
Bacteria, UA: NONE SEEN
RBC, Urine: NONE SEEN /HPF (ref 0–2)

## 2024-01-14 LAB — URINALYSIS, ROUTINE W REFLEX MICROSCOPIC
Bilirubin, UA: NEGATIVE
Glucose, UA: NEGATIVE
Ketones, UA: NEGATIVE
Leukocytes,UA: NEGATIVE
Nitrite, UA: NEGATIVE
RBC, UA: NEGATIVE
Specific Gravity, UA: 1.025 (ref 1.005–1.030)
Urobilinogen, Ur: 0.2 mg/dL (ref 0.2–1.0)
pH, UA: 6 (ref 5.0–7.5)

## 2024-01-14 LAB — BLADDER SCAN AMB NON-IMAGING: Scan Result: 98

## 2024-07-16 NOTE — Progress Notes (Unsigned)
 "    History of Present Illness: 59 year old male seen in July 2025 by Lauraine Oz, NP for follow-up of kidney stone.  He presented to the emergency room at [facility] on December 31, 2023.  That was for left flank pain.  CT stone protocol revealed the following: KIDNEYS/URETERS:  Bilateral intrarenal calculi largest in the right kidney upper pole measuring 1 cm. Largest in the left kidney interpolar region measuring 6 mm. Mild to moderate left hydroureteronephrosis. Obstructing calculus at the distal left ureter just proximal to the left UVJ measuring 6 mm.   Other topics covered with the patient had a history of BPH.  He was also treated for hypogonadism with Xyosted.  This was not prescribed in this office, but he was receiving the prescription elsewhere.  At the emergency room visit for the above, hemoglobin was found to be 19.8, hematocrit 59.3.  It was recommended that the patient get regular phlebotomies, he was referred for endocrine consultation.     Past Medical History:  Diagnosis Date   Anxiety    and atypical chest pain   Chronic back pain    DDD (degenerative disc disease)    Depression    GERD (gastroesophageal reflux disease)    Gout    Hemorrhoids    History of kidney stones    Hypercholesteremia    Hypothyroidism    Labile hypertension    Multilevel degenerative disc disease    Sleep apnea    does not wear CPAP    Past Surgical History:  Procedure Laterality Date   BIOPSY  10/07/2015   Procedure: BIOPSY;  Surgeon: Lamar CHRISTELLA Hollingshead, MD;  [facility];  Service: Endoscopy;;  esophageal bx;    COLONOSCOPY WITH PROPOFOL  N/A 10/07/2015   Dr.Rourk- diverticulosis in the entire examined colon, internal hemorrhoids, the examination was o/w normal   COLONOSCOPY WITH PROPOFOL  N/A 09/18/2019    pancolonic diverticulosis, two 3-8 mm polyps at IC valve s/p removal. Tubular adenomas. Surveillance 5 years.    ESOPHAGOGASTRODUODENOSCOPY (EGD) WITH PROPOFOL  N/A  10/07/2015   Dr.Rourk- esophageal mucosa changes, dilated, small hiatal hernia, the examination was o/w normal bx= benign squamous mucosa with minimal reactive changes   HEMORRHOID BANDING  2017   Dr.Rourk   INGUINAL HERNIA REPAIR Left 04/21/2017   Procedure: LEFT INGUINAL HERNIORRHAPHY WITH MESH;  Surgeon: Mavis Anes, MD;  Location: [facility];  Service: General;  Laterality: Left;   KNEE ARTHROSCOPY Left    MALONEY DILATION N/A 10/07/2015   Procedure: AGAPITO DILATION;  Surgeon: Lamar CHRISTELLA Hollingshead, MD;  [facility];  Service: Endoscopy;  Laterality: N/A;   MANDIBLE FRACTURE SURGERY     wired shut after a fall   neck fusion     POLYPECTOMY  09/18/2019   Procedure: POLYPECTOMY;  Surgeon: Hollingshead Lamar CHRISTELLA, MD;  [facility];  Service: Endoscopy;;    Home Medications:  Allergies as of 07/18/2024       Reactions   Ibuprofen    Lyrica [pregabalin] Swelling        Medication List        Accurate as of July 16, 2024 10:12 AM. If you have any questions, ask your nurse or doctor.          allopurinol 100 MG tablet Commonly known as: ZYLOPRIM Take 200 mg by mouth daily.   amLODipine 5 MG tablet Commonly known as: NORVASC Take 5 mg by mouth daily.   aspirin  EC 81 MG tablet Take 81  mg by mouth daily.   atorvastatin 40 MG tablet Commonly known as: LIPITOR SMARTSIG:1 Tablet(s) By Mouth Every Evening   colchicine 0.6 MG tablet Take 0.6 mg by mouth daily as needed (gout).   levothyroxine 100 MCG tablet Commonly known as: SYNTHROID Take 100 mcg by mouth daily before breakfast.   lisinopril 20 MG tablet Commonly known as: ZESTRIL Take 20 mg by mouth daily.   MISC NATURAL PRODUCTS PO Take by mouth. Clove and Olive Oil   Morphine  Sulfate ER 15 MG T12a Take 15 mg by mouth in the morning and at bedtime.   pantoprazole 40 MG tablet Commonly known as: PROTONIX Take 40 mg by mouth daily before breakfast.   tiZANidine 2 MG tablet Commonly known as:  ZANAFLEX Take 2 mg by mouth every 6 (six) hours as needed for muscle spasms.   Vitamin D (Ergocalciferol) 1.25 MG (50000 UNIT) Caps capsule Commonly known as: DRISDOL Take 50,000 Units by mouth once a week.   Xtampza  ER 13.5 MG C12a Generic drug: oxyCODONE  ER Take 13.5 mg by mouth in the morning and at bedtime.        Allergies: Allergies[1]  Family History  Problem Relation Age of Onset   [name] Mother    Hypertension Mother    Colon cancer Mother 48       Passed away 08-06-2010 from stage IV CRC   Lung cancer Father    Liver disease Neg Hx    Celiac disease Neg Hx     Social History:  reports that he has never smoked. He has never used smokeless tobacco. He reports that he does not drink alcohol and does not use drugs.  ROS: A complete review of systems was performed.  All systems are negative except for pertinent findings as noted.  Physical Exam:  Vital signs in last 24 hours: There were no vitals taken for this visit. Constitutional:  Alert and oriented, No acute distress Cardiovascular: Regular rate  Respiratory: Normal respiratory effort GI: Abdomen is soft, nontender, nondistended, no abdominal masses. No CVAT.  Genitourinary: Normal male phallus, testes are descended bilaterally and non-tender and without masses, scrotum is normal in appearance without lesions or masses, perineum is normal on inspection. Lymphatic: No lymphadenopathy Neurologic: Grossly intact, no focal deficits Psychiatric: Normal mood and affect  I have reviewed prior pt notes  I have reviewed notes from referring/previous physicians--prior [facility] records reviewed through Care Everywhere  I have reviewed urinalysis results  I have independently reviewed prior imaging--CT results from July 2025  I have reviewed prior PSA results--1.5 to in March, 2025  I have reviewed prior CBC results, as above, as well as repeat CBC done in November 2025 where hemoglobin/hematocrit were 15.1/44.2  respectively.   Impression/Assessment:  ***  Plan:  ***     [1]  Allergies Allergen Reactions   Ibuprofen    Lyrica [Pregabalin] Swelling   "

## 2024-07-18 ENCOUNTER — Ambulatory Visit: Admitting: Urology

## 2024-07-18 DIAGNOSIS — D751 Secondary polycythemia: Secondary | ICD-10-CM

## 2024-07-18 DIAGNOSIS — R6882 Decreased libido: Secondary | ICD-10-CM

## 2024-07-18 DIAGNOSIS — N400 Enlarged prostate without lower urinary tract symptoms: Secondary | ICD-10-CM

## 2024-07-18 DIAGNOSIS — N200 Calculus of kidney: Secondary | ICD-10-CM

## 2024-07-18 DIAGNOSIS — E291 Testicular hypofunction: Secondary | ICD-10-CM
